# Patient Record
Sex: Male | Born: 1995 | Race: White | Hispanic: No | Marital: Single | Smoking: Never smoker
Health system: Southern US, Community
[De-identification: ages and names within clinical notes are randomized; demographics above are authoritative.]

## PROBLEM LIST (undated history)

## (undated) DIAGNOSIS — Z789 Other specified health status: Secondary | ICD-10-CM

## (undated) DIAGNOSIS — J302 Other seasonal allergic rhinitis: Secondary | ICD-10-CM

## (undated) HISTORY — PX: SHOULDER SURGERY: SHX246

---
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
## 2017-05-17 ENCOUNTER — Other Ambulatory Visit: Payer: Self-pay

## 2017-05-17 ENCOUNTER — Ambulatory Visit
Admission: EM | Admit: 2017-05-17 | Discharge: 2017-05-17 | Disposition: A | Discharge status: Home / Self Care | Payer: BLUE CROSS/BLUE SHIELD | Attending: Family Medicine | Admitting: Family Medicine

## 2017-05-17 ENCOUNTER — Encounter: Payer: Self-pay | Admitting: Emergency Medicine

## 2017-05-17 DIAGNOSIS — L600 Ingrowing nail: Secondary | ICD-10-CM

## 2017-05-17 DIAGNOSIS — J301 Allergic rhinitis due to pollen: Secondary | ICD-10-CM

## 2017-05-17 DIAGNOSIS — R059 Cough, unspecified: Secondary | ICD-10-CM

## 2017-05-17 DIAGNOSIS — R05 Cough: Secondary | ICD-10-CM | POA: Diagnosis not present

## 2017-05-17 HISTORY — DX: Other seasonal allergic rhinitis: J30.2

## 2017-05-17 MED ORDER — CEPHALEXIN 500 MG PO CAPS: 500.0000 mg | ORAL_CAPSULE | Freq: Three times a day (TID) | ORAL | 0 refills | Status: DC

## 2017-05-17 MED ORDER — BENZONATATE 200 MG PO CAPS: 200.0000 mg | ORAL_CAPSULE | Freq: Three times a day (TID) | ORAL | 0 refills | Status: DC | PRN

## 2017-05-17 NOTE — ED Triage Notes (Signed)
Patient in today with his father c/o cough x 1 month, getting worse over the last 2 weeks. Patient's father states the he  has had some sob. Father denies fever. Patient tried Mucinex last night and took Amoxicillin (from aprevious prescription) Saturday and Sunday.

## 2017-05-17 NOTE — ED Triage Notes (Signed)
Patient's father also states that patient left great toe is swollen and infected.

## 2017-05-17 NOTE — ED Provider Notes (Signed)
[facility]    CSN: [id number] Arrival date & time: 05/17/17  0825     History   Chief Complaint Chief Complaint  Patient presents with  . Cough    HPI Parker Montoya is a 22 y.o. male.   Also c/o left big toe redness, swelling, drainage and pain.   The history is provided by the patient.  Cough  Associated symptoms: rhinorrhea   Associated symptoms: no headaches and no wheezing   URI  Presenting symptoms: congestion, cough and rhinorrhea   Severity:  Moderate Onset quality:  Sudden Duration:  3 weeks Timing:  Constant Progression:  Unchanged Chronicity:  New Associated symptoms: no headaches, no sinus pain and no wheezing   Risk factors: recent illness (allergies)   Risk factors: not elderly, no chronic cardiac disease, no chronic kidney disease, no chronic respiratory disease, no diabetes mellitus, no immunosuppression, no recent travel and no sick contacts     Past Medical History:  Diagnosis Date  . Seasonal allergies     There are no active problems to display for this patient.   Past Surgical History:  Procedure Laterality Date  . SHOULDER SURGERY Right        Home Medications    Prior to Admission medications   Medication Sig Start Date End Date Taking? Authorizing Provider  benzonatate (TESSALON) 200 MG capsule Take 1 capsule (200 mg total) by mouth 3 (three) times daily as needed. 05/17/17   Payton Mccallum, MD  cephALEXin (KEFLEX) 500 MG capsule Take 1 capsule (500 mg total) by mouth 3 (three) times daily. 05/17/17   Payton Mccallum, MD  cetirizine-pseudoephedrine (ZYRTEC-D) 5-120 MG tablet Take 1 tablet by mouth 2 (two) times daily. 02/05/15   Lutricia Feil, PA-C  oseltamivir (TAMIFLU) 75 MG capsule Take 1 capsule (75 mg total) by mouth every 12 (twelve) hours. 02/05/15   Lutricia Feil, PA-C    Family History Family History  Problem Relation Age of Onset  . Hypertension Mother   . Vitamin D deficiency Mother   .  Cardiomyopathy Mother        during pregnancy with twins  . Other Father        unknown - no contact    Social History Social History   Tobacco Use  . Smoking status: Never Smoker  . Smokeless tobacco: Never Used  Substance Use Topics  . Alcohol use: No  . Drug use: No     Allergies   Patient has no known allergies.   Review of Systems Review of Systems  HENT: Positive for congestion and rhinorrhea. Negative for sinus pain.   Respiratory: Positive for cough. Negative for wheezing.   Neurological: Negative for headaches.     Physical Exam Triage Vital Signs ED Triage Vitals  Enc Vitals Group     BP 05/17/17 0845 138/85     Pulse Rate 05/17/17 0845 (!) 101     Resp 05/17/17 0845 16     Temp 05/17/17 0845 98.4 F (36.9 C)     Temp Source 05/17/17 0845 Oral     SpO2 05/17/17 0845 97 %     Weight 05/17/17 0844 285 lb 6.4 oz (129.5 kg)     Height 05/17/17 0844  (1.676 m)     Head Circumference --      Peak Flow --      Pain Score 05/17/17 0844 0     Pain Loc --      Pain Edu? --  Excl. in GC? --    No data found.  Updated Vital Signs BP 138/85 (BP Location: Left Arm) Comment (BP Location): lg cuff  Pulse (!) 101   Temp 98.4 F (36.9 C) (Oral)   Resp 16   Ht  (1.676 m)   Wt 285 lb 6.4 oz (129.5 kg)   SpO2 97%   BMI 46.06 kg/m   Visual Acuity Right Eye Distance:   Left Eye Distance:   Bilateral Distance:    Right Eye Near:   Left Eye Near:    Bilateral Near:     Physical Exam  Constitutional: He appears well-developed and well-nourished. No distress.  HENT:  Head: Normocephalic and atraumatic.  Right Ear: Tympanic membrane, external ear and ear canal normal.  Left Ear: Tympanic membrane, external ear and ear canal normal.  Nose: Nose normal.  Mouth/Throat: Uvula is midline, oropharynx is clear and moist and mucous membranes are normal. No oropharyngeal exudate or tonsillar abscesses.  Eyes: Conjunctivae are normal. Right eye  exhibits no discharge. Left eye exhibits no discharge. No scleral icterus.  Neck: Normal range of motion. Neck supple. No tracheal deviation present. No thyromegaly present.  Cardiovascular: Normal rate, regular rhythm and normal heart sounds.  Pulmonary/Chest: Effort normal and breath sounds normal. No stridor. No respiratory distress. He has no wheezes. He has no rales. He exhibits no tenderness.  Lymphadenopathy:    He has no cervical adenopathy.  Neurological: He is alert.  Skin: Skin is warm and dry. No rash noted. He is not diaphoretic.  Left big toe skin with edema, erythema and mild drainage; ingrown toenail  Nursing note and vitals reviewed.    UC Treatments / Results  Labs (all labs ordered are listed, but only abnormal results are displayed) Labs Reviewed - No data to display  EKG None  Radiology No results found.  Procedures Procedures (including critical care time)  Medications Ordered in UC Medications - No data to display  Initial Impression / Assessment and Plan / UC Course  I have reviewed the triage vital signs and the nursing notes.  Pertinent labs & imaging results that were available during my care of the patient were reviewed by me and considered in my medical decision making (see chart for details).      Final Clinical Impressions(s) / UC Diagnoses   Final diagnoses:  Cough  Ingrown toenail of left foot  Seasonal allergic rhinitis due to pollen    ED Prescriptions    Medication Sig Dispense Auth. Provider   cephALEXin (KEFLEX) 500 MG capsule Take 1 capsule (500 mg total) by mouth 3 (three) times daily. 30 capsule Payton Mccallum, MD   benzonatate (TESSALON) 200 MG capsule Take 1 capsule (200 mg total) by mouth 3 (three) times daily as needed. 30 capsule Payton Mccallum, MD     1. diagnosis reviewed with patient 2. rx as per orders above; reviewed possible side effects, interactions, risks and benefits  3. Recommend supportive treatment with  warm compresses 4. Follow-up prn if symptoms worsen or don't improve   Controlled Substance Prescriptions [place] Controlled Substance Registry consulted? Not Applicable   Payton Mccallum, MD 05/17/17 1220

## 2020-12-18 ENCOUNTER — Encounter: Payer: Self-pay | Admitting: Orthopaedic Surgery

## 2020-12-18 ENCOUNTER — Ambulatory Visit (INDEPENDENT_AMBULATORY_CARE_PROVIDER_SITE_OTHER): Payer: BC Managed Care – PPO | Admitting: Orthopaedic Surgery

## 2020-12-18 ENCOUNTER — Other Ambulatory Visit: Payer: Self-pay

## 2020-12-18 ENCOUNTER — Ambulatory Visit: Payer: Self-pay

## 2020-12-18 DIAGNOSIS — M25561 Pain in right knee: Secondary | ICD-10-CM | POA: Diagnosis not present

## 2020-12-18 NOTE — Progress Notes (Signed)
Office Visit Note   Patient: Parker Montoya           Date of Birth: [birth year]           MRN: [id number] Visit Date: 12/18/2020              Requested by: No referring provider defined for this encounter. PCP: Pcp, No   Assessment & Plan: Visit Diagnoses:  1. Acute pain of right knee     Plan: Impression is right knee MCL strain.  I placed patient in a hinged knee brace weightbearing as tolerated.  We will go ahead and order an urgent MRI to assess for structural abnormalities.  Follow-up with [place] once completed.  We have provided without a work note until after his MRI as he has a very physical job and does not have a light duty or desk work option.  He is agreeable to this plan.  Follow-Up Instructions: Return for f/u after MRI.   Orders:  Orders Placed This Encounter  Procedures   XR KNEE 3 VIEW RIGHT   No orders of the defined types were placed in this encounter.     Procedures: No procedures performed   Clinical Data: No additional findings.   Subjective: Chief Complaint  Patient presents with   Right Knee - Pain    HPI patient is a pleasant 25 year old gentleman who comes in today with concerns about his right knee.  This past Saturday, he was doing jujitsu when he sprawled and felt a pop to the medial knee.  He has had pain to the anterior medial knee since.  He denies any pain at rest.  Pain is worse going up and down stairs.  He denies any mechanical symptoms or instability.  Has been using ice and ibuprofen.  He does have previous history of MCL sprain years ago back in high school and is recovered from this..  Review of Systems as detailed in HPI.  All other reviewed and are negative.   Objective: Vital Signs: There were no vitals taken for this visit.  Physical Exam well-developed well-nourished gentleman in no acute distress.  Alert and oriented x3.  Ortho Exam right knee exam shows a small effusion.  Range of motion 0 to 120 degrees.  He does have  medial joint line tenderness as well as tenderness along the MCL.  He does have laxity with valgus stress.  Very minimal laxity with anterior drawer compared to the left knee.  He is neurovascular intact distally.  Specialty Comments:  No specialty comments available.  Imaging: XR KNEE 3 VIEW RIGHT  Result Date: 12/18/2020 No acute or structural abnormalities    PMFS History: There are no problems to display for this patient.  Past Medical History:  Diagnosis Date   Seasonal allergies     Family History  Problem Relation Age of Onset   Hypertension Mother    Vitamin D deficiency Mother    Cardiomyopathy Mother        during pregnancy with twins   Other Father        unknown - no contact    Past Surgical History:  Procedure Laterality Date   SHOULDER SURGERY Right    Social History   Occupational History   Not on file  Tobacco Use   Smoking status: Never   Smokeless tobacco: Never  Vaping Use   Vaping Use: Never used  Substance and Sexual Activity   Alcohol use: No   Drug use: No  Sexual activity: Not on file

## 2020-12-19 ENCOUNTER — Ambulatory Visit
Admission: RE | Admit: 2020-12-19 | Discharge: 2020-12-19 | Disposition: A | Discharge status: Home / Self Care | Payer: BC Managed Care – PPO | Source: Ambulatory Visit | Attending: Orthopaedic Surgery | Admitting: Orthopaedic Surgery

## 2020-12-19 DIAGNOSIS — M25561 Pain in right knee: Secondary | ICD-10-CM

## 2020-12-19 IMAGING — MR MR KNEE*R* W/O CM
4 of 7 series · 23 of 40 positions shown · non-contrast
Comparison: Right knee radiograph [DATE]

CLINICAL DATA: Right knee pain

EXAM:
MRI OF THE RIGHT KNEE WITHOUT CONTRAST
TECHNIQUE: Multiplanar, multisequence MR imaging of the knee was performed. No
intravenous contrast was administered.

[Series 4: T1 · coronal · 4.0mm · 0.29mm/px · 5 of 29 slices shown]
[im 1/29]
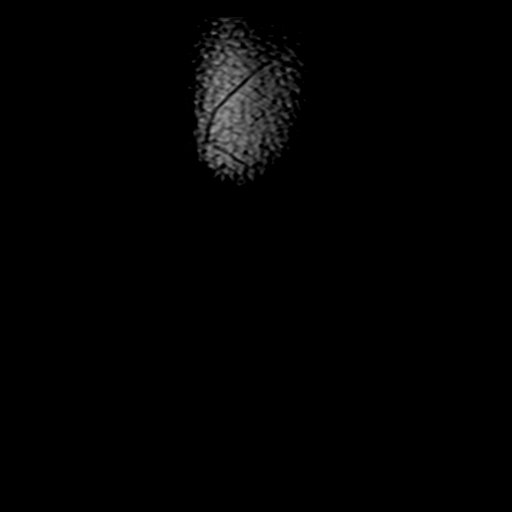
[im 5/29]
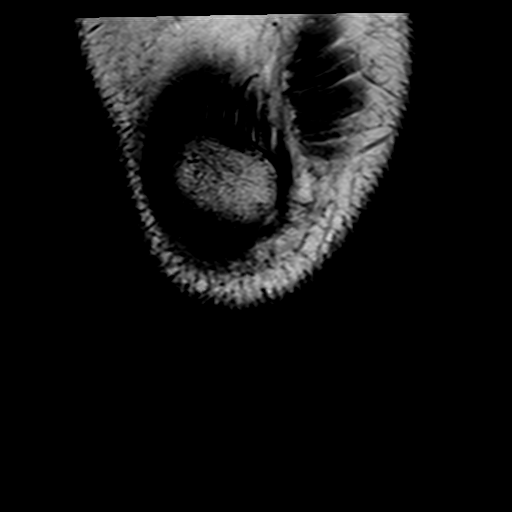
[im 10/29]
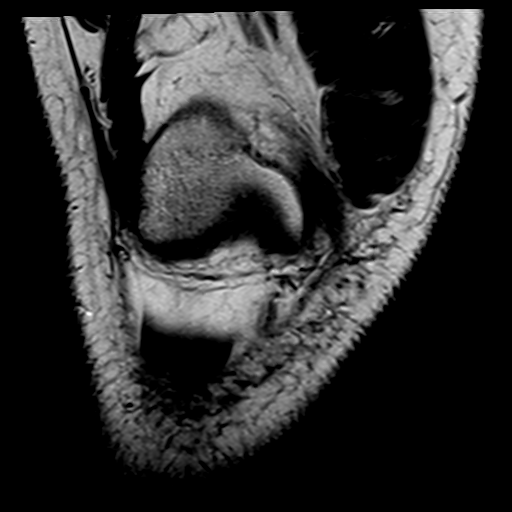
[im 15/29]
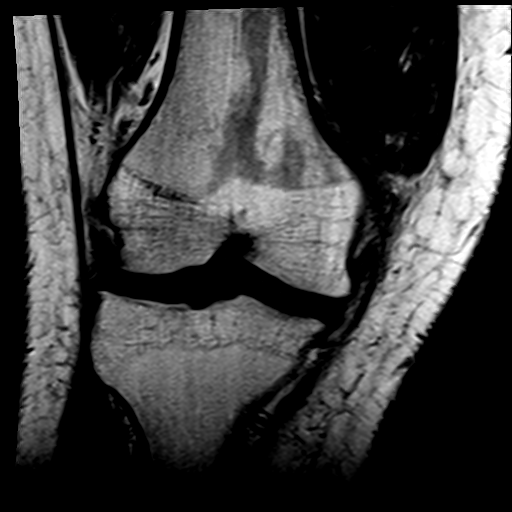
[im 24/29]
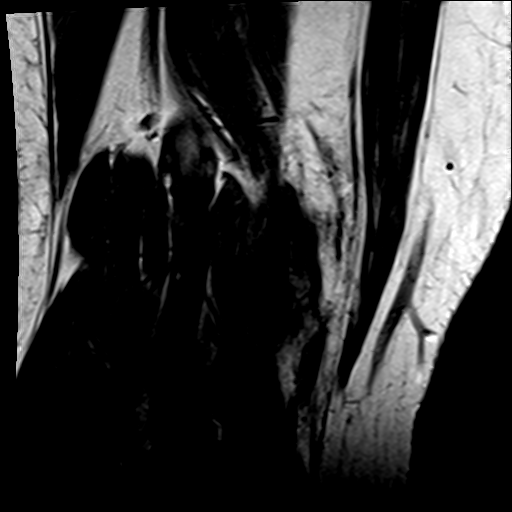

[Series 5: T2 fat-sat · coronal · 4.0mm · 0.59mm/px · 6 of 25 slices shown (1 of 2)]
[im 1/25]
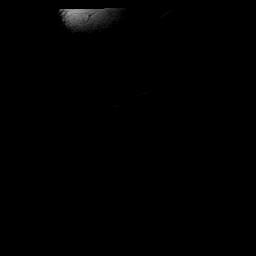
[im 5/25]
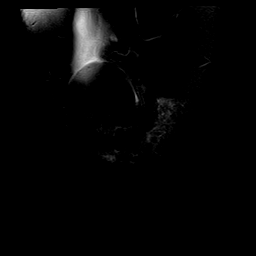
[im 10/25]
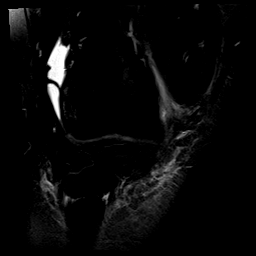
[im 15/25]
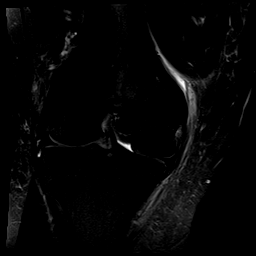
[im 20/25]
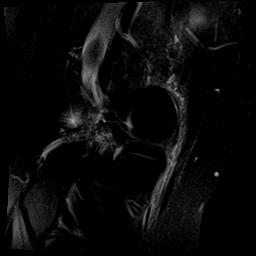
[im 25/25]
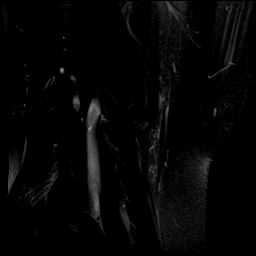

[Series 7: PD fat-sat · sagittal · 3.0mm · 0.29mm/px · 6 of 27 slices shown]
[im 1/27]
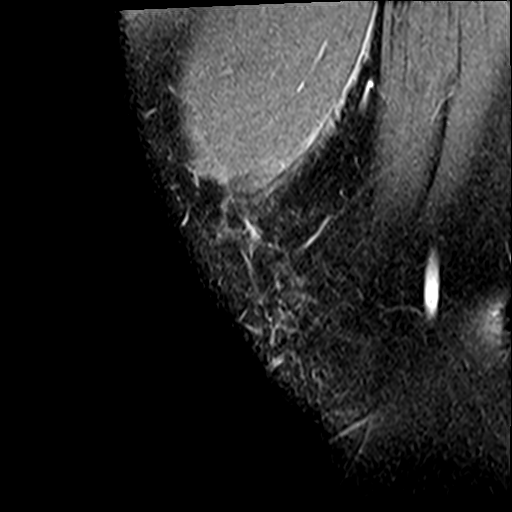
[im 6/27]
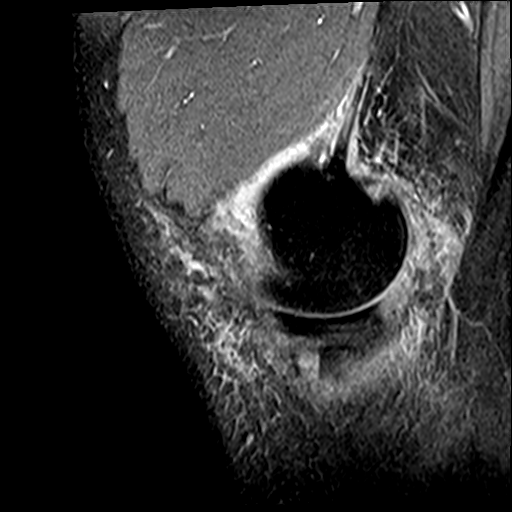
[im 11/27]
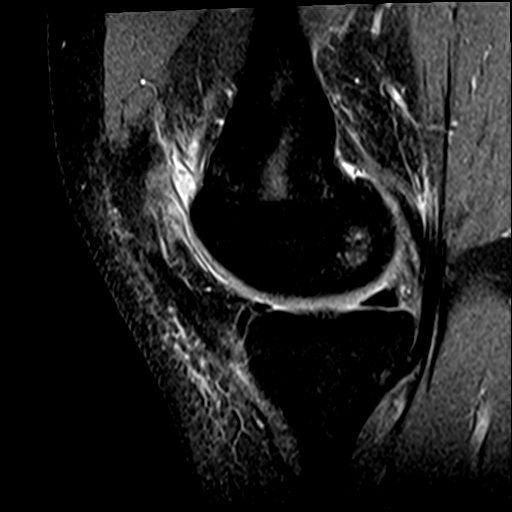
[im 16/27]
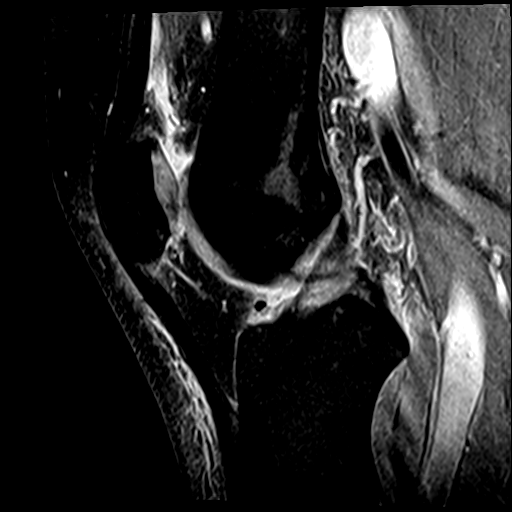
[im 21/27]
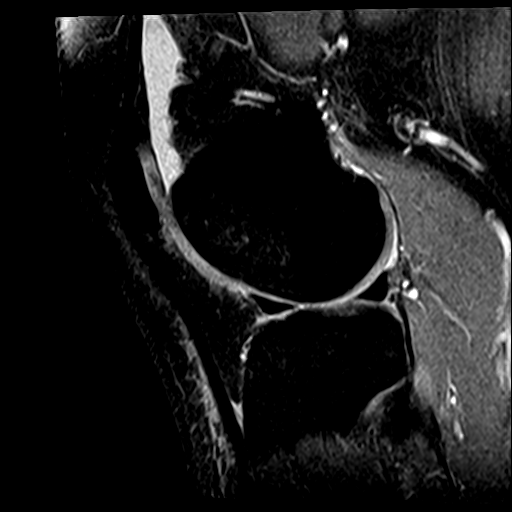
[im 27/27]
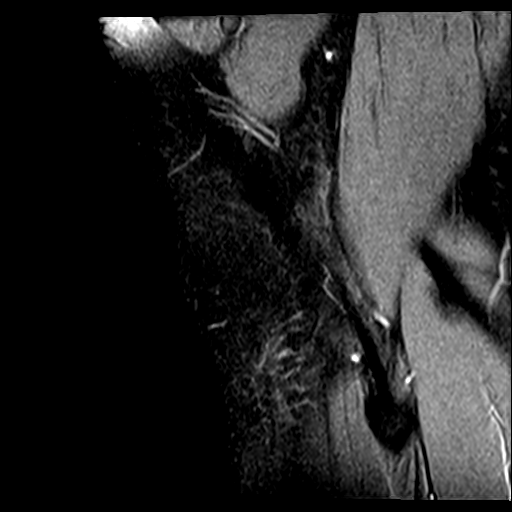

[Series 8: T2 fat-sat · sagittal · 3.0mm · 0.29mm/px · 6 of 27 slices shown (2 of 2)]
[im 1/27]
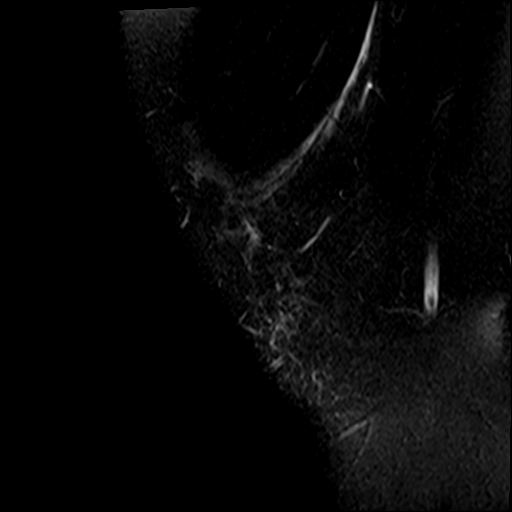
[im 6/27]
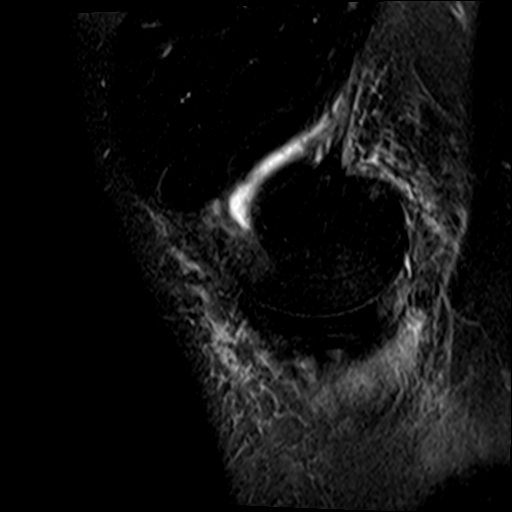
[im 11/27]
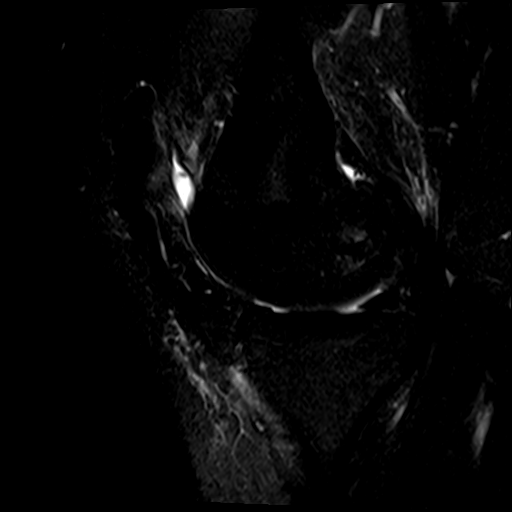
[im 16/27]
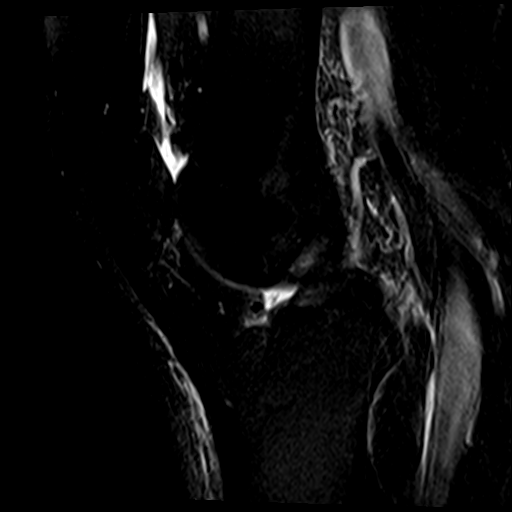
[im 21/27]
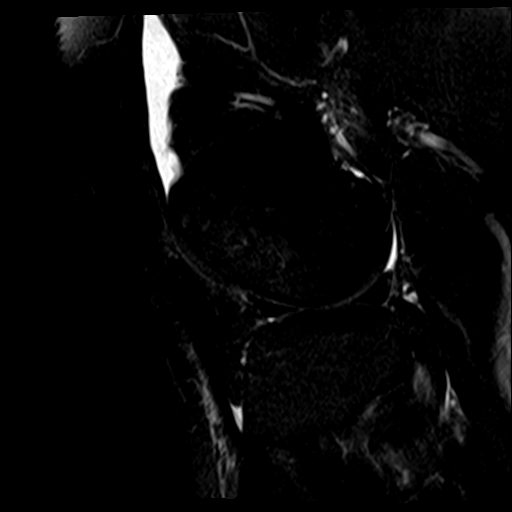
[im 27/27]
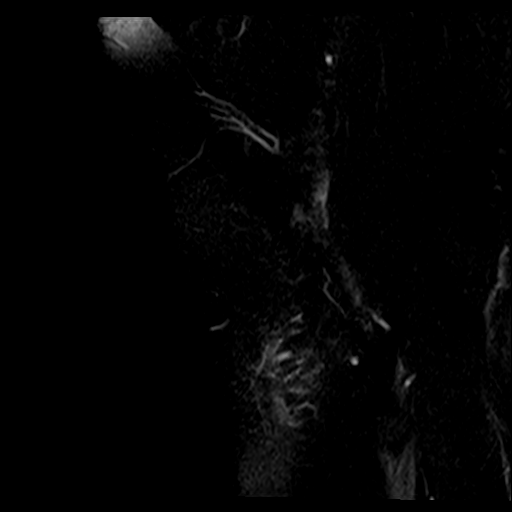

[23 of 40 positions shown; findings below may reference images not displayed]

FINDINGS: MENISCI

Medial: Intact.

Lateral: Intact.

LIGAMENTS

Cruciates: ACL and PCL are intact.

Collaterals: There is periligamentous edema along the medial
collateral ligament. Lateral collateral ligament complex is intact.

CARTILAGE

Patellofemoral:  No chondral defect.

Medial:  No chondral defect.

Lateral:  No chondral defect.

JOINT: Moderate-sized joint effusion.

POPLITEAL FOSSA: No significant Baker cyst.

EXTENSOR MECHANISM: The medial patellofemoral ligament is completely
torn from its femoral sided attachment (axial T2 image 15). Slight
lateral positioning of the patella. TT-TG distance measures 15 mm.
No patella Alta. Trochlear depth measures 4 mm.

BONES: Bony contusion within the lateral femoral condyle.

Other: Soft tissue swelling along the knee. No focal fluid
collection.
IMPRESSION: Sequela of recent transient patellar dislocation. Torn MPFL from its
femoral sided attachment with slight lateral subluxation of the
patella. TT-TG distance measures 15 mm.

Grade 1 MCL sprain.

No evidence of meniscus tear or cruciate ligament injury.

Moderate-sized joint effusion.

## 2020-12-19 IMAGING — MR MR KNEE*R* W/O CM
3 series · 40 of 40 positions shown · non-contrast
Comparison: Right knee radiograph [DATE]

CLINICAL DATA: Right knee pain

EXAM:
MRI OF THE RIGHT KNEE WITHOUT CONTRAST
TECHNIQUE: Multiplanar, multisequence MR imaging of the knee was performed. No
intravenous contrast was administered.

[Series 3: t2_tse_fs_tra · axial · 4.0mm · 0.50mm/px · z∈[-71,+58]mm · 14 of 28 slices shown (1 of 2)]
[im 1/28]
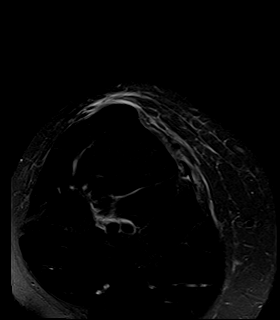
[im 3/28]
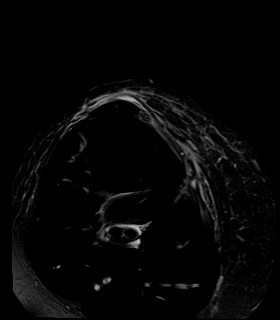
[im 5/28]
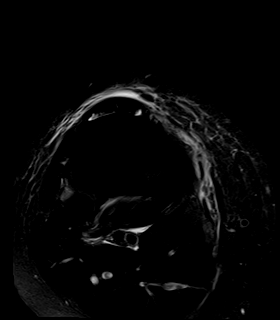
[im 7/28]
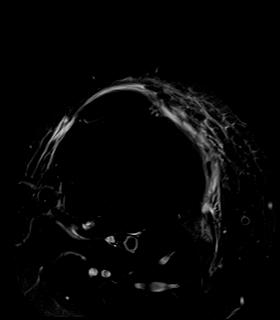
[im 9/28]
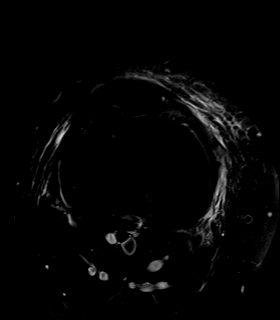
[im 11/28]
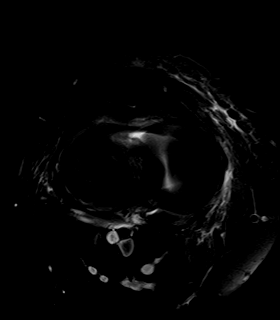
[im 13/28]
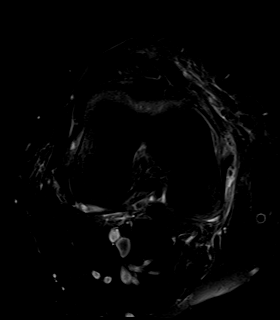
[im 15/28]
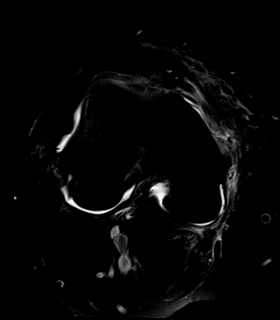
[im 17/28]
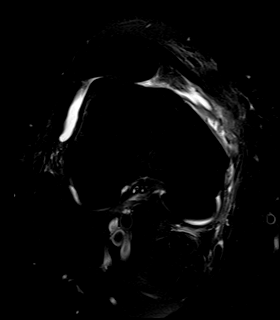
[im 19/28]
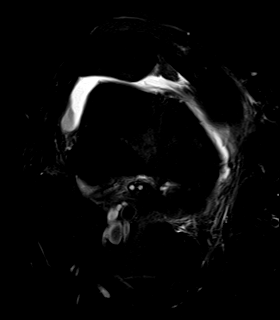
[im 21/28]
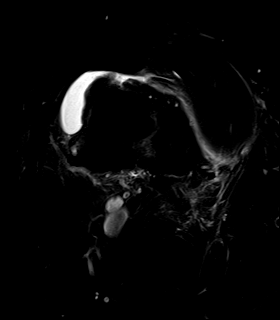
[im 23/28]
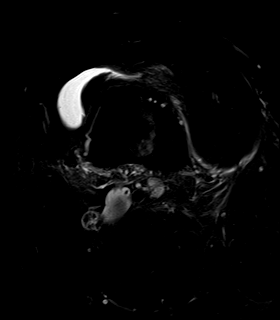
[im 25/28]
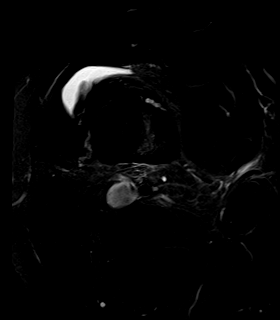
[im 28/28]
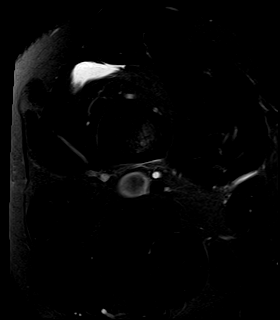

[Series 4: PD · axial · 4.0mm · 0.50mm/px · z∈[-62,+49]mm · 12 of 24 slices shown]
[im 1/24]
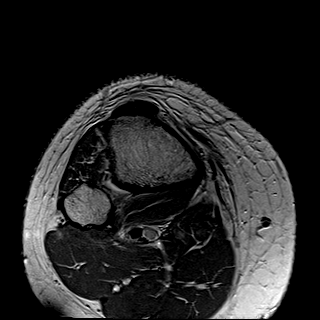
[im 3/24]
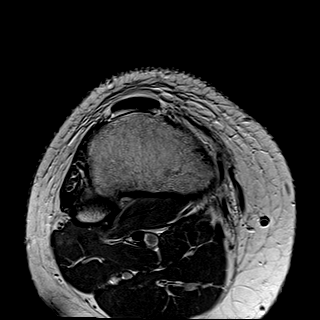
[im 5/24]
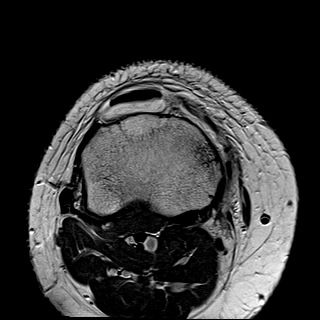
[im 7/24]
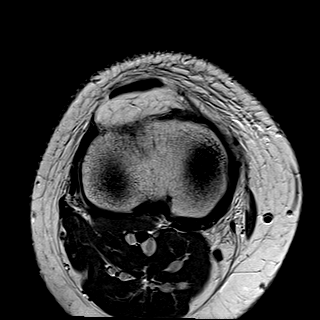
[im 9/24]
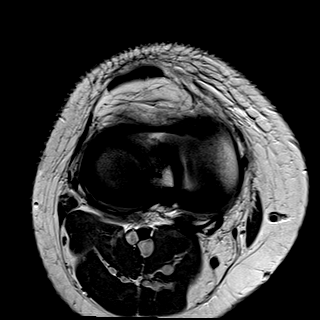
[im 11/24]
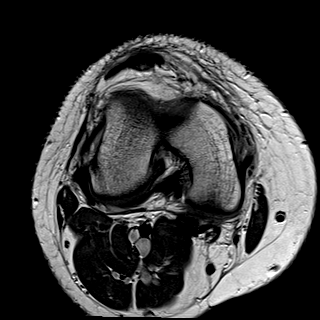
[im 13/24]
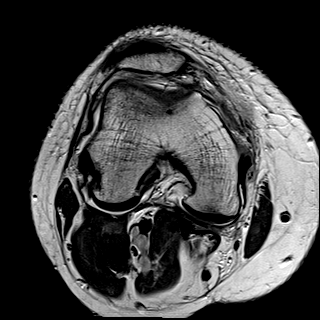
[im 15/24]
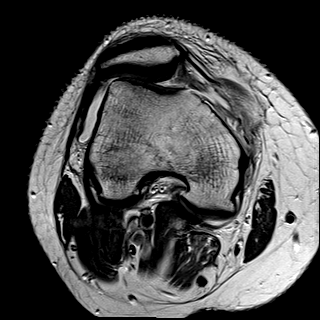
[im 17/24]
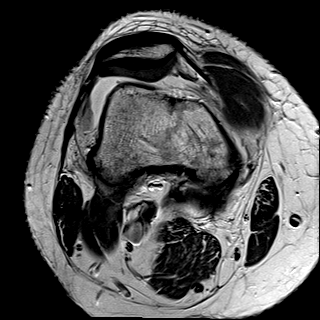
[im 19/24]
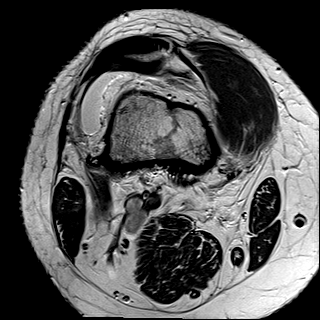
[im 21/24]
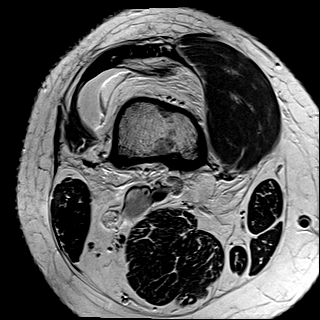
[im 24/24]
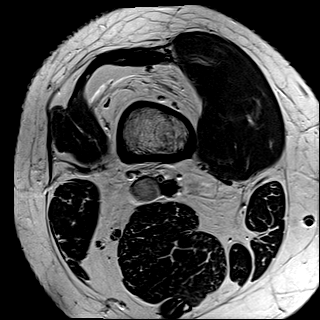

[Series 5: t2_tse_fs_tra · axial · 4.0mm · 0.50mm/px · z∈[-71,+58]mm · 14 of 28 slices shown (2 of 2)]
[im 1/28]
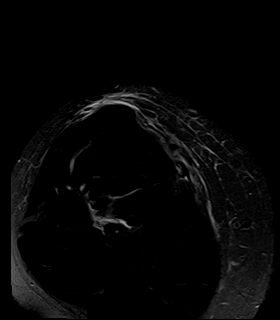
[im 3/28]
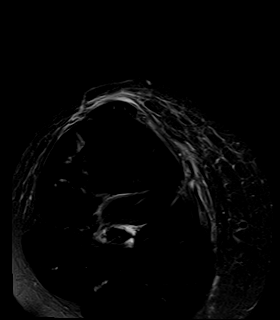
[im 5/28]
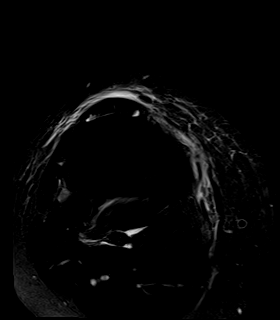
[im 7/28]
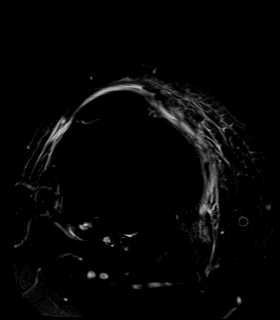
[im 9/28]
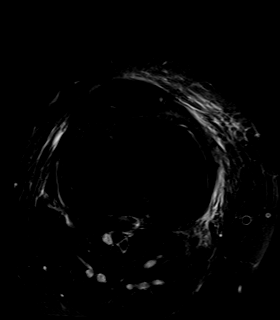
[im 11/28]
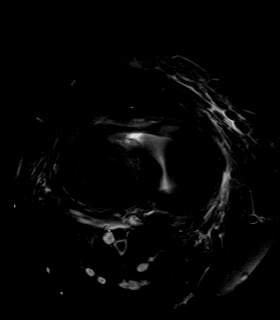
[im 13/28]
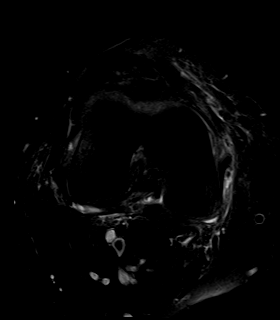
[im 15/28]
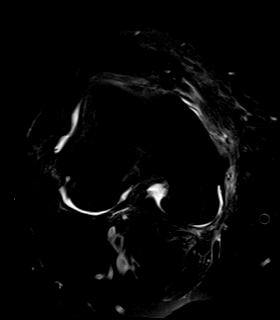
[im 17/28]
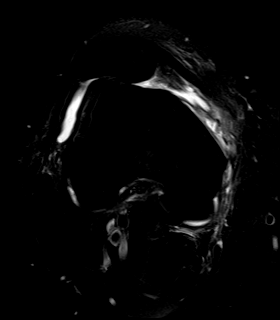
[im 19/28]
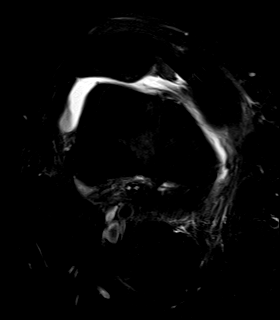
[im 21/28]
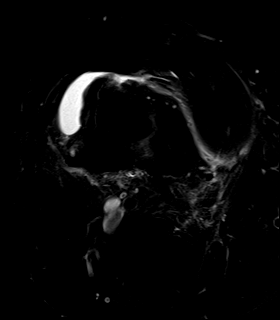
[im 23/28]
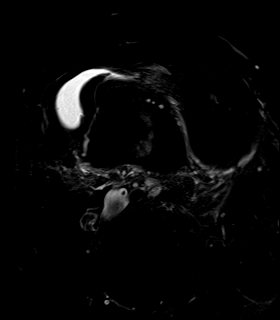
[im 25/28]
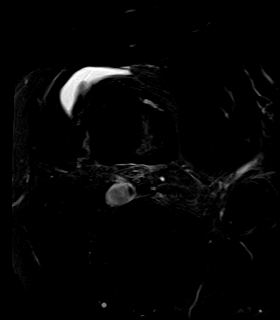
[im 28/28]
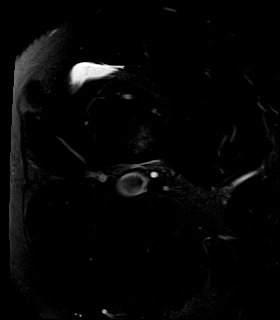

[40 of 40 positions shown; findings below may reference images not displayed]

FINDINGS: MENISCI

Medial: Intact.

Lateral: Intact.

LIGAMENTS

Cruciates: ACL and PCL are intact.

Collaterals: There is periligamentous edema along the medial
collateral ligament. Lateral collateral ligament complex is intact.

CARTILAGE

Patellofemoral:  No chondral defect.

Medial:  No chondral defect.

Lateral:  No chondral defect.

JOINT: Moderate-sized joint effusion.

POPLITEAL FOSSA: No significant Baker cyst.

EXTENSOR MECHANISM: The medial patellofemoral ligament is completely
torn from its femoral sided attachment (axial T2 image 15). Slight
lateral positioning of the patella. TT-TG distance measures 15 mm.
No patella Alta. Trochlear depth measures 4 mm.

BONES: Bony contusion within the lateral femoral condyle.

Other: Soft tissue swelling along the knee. No focal fluid
collection.
IMPRESSION: Sequela of recent transient patellar dislocation. Torn MPFL from its
femoral sided attachment with slight lateral subluxation of the
patella. TT-TG distance measures 15 mm.

Grade 1 MCL sprain.

No evidence of meniscus tear or cruciate ligament injury.

Moderate-sized joint effusion.

## 2020-12-24 ENCOUNTER — Other Ambulatory Visit: Payer: Self-pay

## 2020-12-24 ENCOUNTER — Ambulatory Visit (INDEPENDENT_AMBULATORY_CARE_PROVIDER_SITE_OTHER): Payer: BC Managed Care – PPO | Admitting: Orthopaedic Surgery

## 2020-12-24 ENCOUNTER — Encounter: Payer: Self-pay | Admitting: Orthopaedic Surgery

## 2020-12-24 DIAGNOSIS — M25361 Other instability, right knee: Secondary | ICD-10-CM | POA: Diagnosis not present

## 2020-12-24 DIAGNOSIS — M25561 Pain in right knee: Secondary | ICD-10-CM

## 2020-12-24 NOTE — Progress Notes (Signed)
Office Visit Note   Patient: Parker Montoya           Date of Birth: May 13, 1995           MRN: [id number] Visit Date: 12/24/2020              Requested by: No referring provider defined for this encounter. PCP: Pcp, No   Assessment & Plan: Visit Diagnoses:  1. 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  No significant changes to the knee.  Right knee exam show stable collaterals and cruciates.  Positive patellar apprehension tenderness to the medial retinaculum and the medial femoral condyle.  Laterally tracking patella.  Small effusion.  The MRI of the right knee shows full-thickness tear of the femoral insertion of the medial patellofemoral ligament.  Femoral trochlea appears to be shallow.  No significant patella alta.  Given these findings treatment options were discussed.  Patient would really like to return back to work and to martial arts soon as possible therefore I have recommended MPFL reconstruction.  We provided him with a PSO brace for today.  Light duty for for now.  Risk benefits rehab recovery of the surgery reviewed with the patient detail.  Follow-Up Instructions: No follow-ups on file.   Orders:  No orders of the defined types were placed in this encounter.  No orders of the defined types were placed in this encounter.     Procedures: No procedures performed   Clinical Data: No additional findings.   Subjective: Chief Complaint  Patient presents with   Right Knee - Follow-up    MRI review    HPI  Review of Systems  Constitutional: Negative.   All other systems reviewed and are negative.   Objective: Vital Signs: There were no vitals taken for this visit.  Physical Exam Vitals and nursing note reviewed.  Constitutional:      Appearance: He is well-developed.  Pulmonary:     Effort: Pulmonary effort is normal.  Abdominal:     Palpations: Abdomen is soft.  Skin:     General: Skin is warm.  Neurological:     Mental Status: He is alert and oriented to person, place, and time.  Psychiatric:        Behavior: Behavior normal.        Thought Content: Thought content normal.        Judgment: Judgment normal.    Ortho Exam  Specialty Comments:  No specialty comments available.  Imaging: No results found.   PMFS History: There are no problems to display for this patient.  Past Medical History:  Diagnosis Date   Seasonal allergies     Family History  Problem Relation Age of Onset   Hypertension Mother    Vitamin D deficiency Mother    Cardiomyopathy Mother        during pregnancy with twins   Other Father        unknown - no contact    Past Surgical History:  Procedure Laterality Date   SHOULDER SURGERY Right    Social History   Occupational History   Not on file  Tobacco Use   Smoking status: Never   Smokeless tobacco: Never  Vaping Use   Vaping Use: Never used  Substance and Sexual Activity   Alcohol use: No   Drug use: No   Sexual activity: Not on file

## 2020-12-25 ENCOUNTER — Telehealth: Payer: Self-pay | Admitting: Orthopaedic Surgery

## 2020-12-25 ENCOUNTER — Ambulatory Visit (INDEPENDENT_AMBULATORY_CARE_PROVIDER_SITE_OTHER): Payer: BC Managed Care – PPO | Admitting: Orthopaedic Surgery

## 2020-12-25 DIAGNOSIS — M25561 Pain in right knee: Secondary | ICD-10-CM

## 2020-12-25 DIAGNOSIS — M25361 Other instability, right knee: Secondary | ICD-10-CM

## 2020-12-25 NOTE — Telephone Encounter (Signed)
Received $25.00 cash, medical records release form and FMLA/Disability paperwork from patient      Forwarding to [employer] today 

## 2020-12-25 NOTE — Progress Notes (Signed)
cancelled

## 2020-12-25 NOTE — Telephone Encounter (Signed)
Patient asked for a call to schedule surgery. The number to contact patient is [phone]

## 2020-12-30 ENCOUNTER — Other Ambulatory Visit: Payer: Self-pay

## 2020-12-30 ENCOUNTER — Ambulatory Visit
Admission: EM | Admit: 2020-12-30 | Discharge: 2020-12-30 | Disposition: A | Discharge status: Home / Self Care | Payer: BC Managed Care – PPO | Attending: Emergency Medicine | Admitting: Emergency Medicine

## 2020-12-30 ENCOUNTER — Ambulatory Visit: Admit: 2020-12-30 | Discharge status: Absent without leave | Payer: Self-pay

## 2020-12-30 DIAGNOSIS — Z20822 Contact with and (suspected) exposure to covid-19: Secondary | ICD-10-CM | POA: Diagnosis not present

## 2020-12-30 DIAGNOSIS — J101 Influenza due to other identified influenza virus with other respiratory manifestations: Secondary | ICD-10-CM | POA: Diagnosis not present

## 2020-12-30 LAB — RESP PANEL BY RT-PCR (FLU A&B, COVID) ARPGX2
Influenza A by PCR: POSITIVE — AB
Influenza B by PCR: NEGATIVE
SARS Coronavirus 2 by RT PCR: NEGATIVE

## 2020-12-30 MED ORDER — OSELTAMIVIR PHOSPHATE 75 MG PO CAPS: 75.0000 mg | ORAL_CAPSULE | Freq: Two times a day (BID) | ORAL | 0 refills | Status: DC

## 2020-12-30 MED ORDER — PROMETHAZINE-DM 6.25-15 MG/5ML PO SYRP: 5.0000 mL | ORAL_SOLUTION | Freq: Four times a day (QID) | ORAL | 0 refills | Status: DC | PRN

## 2020-12-30 MED ORDER — BENZONATATE 100 MG PO CAPS: 100.0000 mg | ORAL_CAPSULE | Freq: Three times a day (TID) | ORAL | 0 refills | Status: DC

## 2020-12-30 NOTE — ED Provider Notes (Signed)
[facility]    CSN: [id number] Arrival date & time: 12/30/20  0957      History   Chief Complaint Chief Complaint  Patient presents with   Flu like Symptoms    HPI Parker Montoya is a 25 y.o. male.   Patient presents with chills, fever, nasal congestion, rhinorrhea, sore throat, productive cough, chest tightness, vomiting and intermittent generalized headaches for 2 days.  Last episode of vomiting 1 day ago.  Tolerating food and liquid. Has attempted, tylenol, viramins, somewhst helpful.  Known sick contacts.  History of seasonal allergies.  Past Medical History:  Diagnosis Date   Seasonal allergies     There are no problems to display for this patient.   Past Surgical History:  Procedure Laterality Date   SHOULDER SURGERY Right        Home Medications    Prior to Admission medications   Medication Sig Start Date End Date Taking? Authorizing Provider  acetaminophen (TYLENOL) 325 MG tablet Take 650 mg by mouth every 6 (six) hours as needed.   Yes [provider]  UNABLE TO FIND Med Name: Vitamins (multiple)   Yes [provider]  UNKNOWN TO PATIENT Allergy Med.   Yes [provider]  benzonatate (TESSALON) 200 MG capsule Take 1 capsule (200 mg total) by mouth 3 (three) times daily as needed. 05/17/17   Payton Mccallum, MD  cephALEXin (KEFLEX) 500 MG capsule Take 1 capsule (500 mg total) by mouth 3 (three) times daily. 05/17/17   Payton Mccallum, MD  cetirizine-pseudoephedrine (ZYRTEC-D) 5-120 MG tablet Take 1 tablet by mouth 2 (two) times daily. 02/05/15   Lutricia Feil, PA-C  oseltamivir (TAMIFLU) 75 MG capsule Take 1 capsule (75 mg total) by mouth every 12 (twelve) hours. 02/05/15   Lutricia Feil, PA-C    Family History Family History  Problem Relation Age of Onset   Hypertension Mother    Vitamin D deficiency Mother    Cardiomyopathy Mother        during pregnancy with twins   Other Father        unknown - no contact     Social History Social History   Tobacco Use   Smoking status: Never   Smokeless tobacco: Never  Vaping Use   Vaping Use: Never used  Substance Use Topics   Alcohol use: No   Drug use: No     Allergies   Patient has no known allergies.   Review of Systems Review of Systems  Constitutional:  Positive for chills and fever. Negative for activity change, appetite change, diaphoresis, fatigue and unexpected weight change.  HENT:  Positive for congestion, rhinorrhea and sore throat. Negative for dental problem, drooling, ear discharge, ear pain, facial swelling, hearing loss, mouth sores, nosebleeds, postnasal drip, sinus pressure, sinus pain, sneezing, tinnitus, trouble swallowing and voice change.   Respiratory:  Positive for cough. Negative for apnea, choking, chest tightness, shortness of breath, wheezing and stridor.   Cardiovascular: Negative.   Gastrointestinal:  Positive for vomiting. Negative for abdominal distention, abdominal pain, anal bleeding, blood in stool, constipation, diarrhea, nausea and rectal pain.  Skin: Negative.   Neurological:  Positive for headaches. Negative for dizziness, tremors, seizures, syncope, facial asymmetry, speech difficulty, weakness, light-headedness and numbness.    Physical Exam Triage Vital Signs ED Triage Vitals  Enc Vitals Group     BP 12/30/20 1007 (!) 154/81     Pulse Rate 12/30/20 1007 (!) 123     Resp  12/30/20 1007 (!) 24     Temp 12/30/20 1007 99.1 F (37.3 C)     Temp Source 12/30/20 1007 Oral     SpO2 12/30/20 1007 96 %     Weight 12/30/20 1009 (!) 311 lb (141.1 kg)     Height --      Head Circumference --      Peak Flow --      Pain Score 12/30/20 1006 8     Pain Loc --      Pain Edu? --      Excl. in GC? --    No data found.  Updated Vital Signs BP (!) 154/81 (BP Location: Left Arm)    Pulse (!) 114    Temp 99.1 F (37.3 C) (Oral)    Resp (!) 24    Wt (!) 311 lb (141.1 kg)    SpO2 96%    BMI 50.20 kg/m    Visual Acuity Right Eye Distance:   Left Eye Distance:   Bilateral Distance:    Right Eye Near:   Left Eye Near:    Bilateral Near:     Physical Exam Constitutional:      Appearance: Normal appearance.  HENT:     Head: Normocephalic.     Right Ear: Tympanic membrane, ear canal and external ear normal.     Left Ear: Tympanic membrane, ear canal and external ear normal.     Nose: Congestion present.     Mouth/Throat:     Mouth: Mucous membranes are moist.     Pharynx: Posterior oropharyngeal erythema present.  Eyes:     Extraocular Movements: Extraocular movements intact.  Cardiovascular:     Rate and Rhythm: Normal rate and regular rhythm.     Pulses: Normal pulses.     Heart sounds: Normal heart sounds.  Pulmonary:     Effort: Pulmonary effort is normal.     Breath sounds: Normal breath sounds.  Musculoskeletal:     Cervical back: Normal range of motion and neck supple.  Skin:    General: Skin is warm and dry.  Neurological:     Mental Status: He is alert and oriented to person, place, and time. Mental status is at baseline.  Psychiatric:        Mood and Affect: Mood normal.        Behavior: Behavior normal.     UC Treatments / Results  Labs (all labs ordered are listed, but only abnormal results are displayed) Labs Reviewed  RESP PANEL BY RT-PCR (FLU A&B, COVID) [id number]    EKG   Radiology No results found.  Procedures Procedures (including critical care time)  Medications Ordered in UC Medications - No data to display  Initial Impression / Assessment and Plan / UC Course  I have reviewed the triage vital signs and the nursing notes.  Pertinent labs & imaging results that were available during my care of the patient were reviewed by me and considered in my medical decision making (see chart for details).  Influenza A  Negative for COVID Tamiflu 75 mg bid for 5 days Tessalon 100 mg tid prn Promethazine DM 6.25-15mg /15mL qid prn  Over-the-counter  medications remaining symptom management 6. urgent care follow-up as needed Final Clinical Impressions(s) / UC Diagnoses   Final diagnoses:  None   Discharge Instructions   None    ED Prescriptions   None    PDMP not reviewed this encounter.   Valinda Hoar, NP 12/30/20 1121

## 2020-12-30 NOTE — Discharge Instructions (Addendum)
Your flu test was positive, negative for COVID  You may take Tamiflu twice daily for the next 5 days, if you are able to get this medication from the pharmacy then continue supportive treatment, symptoms will improve as the virus works as well after system whether or not you take this medication  May use Tessalon pill every 8 hours as needed for to help calm coughing  You may use cough syrup every 6 hours, this medication may make you drowsy, if this occurs you may take at bedtime    You can take Tylenol and/or Ibuprofen as needed for fever reduction and pain relief.   For cough: honey 1/2 to 1 teaspoon (you can dilute the honey in water or another fluid).  You can also use guaifenesin and dextromethorphan for cough. You can use a humidifier for chest congestion and cough.  If you don't have a humidifier, you can sit in the bathroom with the hot shower running.      For sore throat: try warm salt water gargles, cepacol lozenges, throat spray, warm tea or water with lemon/honey, popsicles or ice, or OTC cold relief medicine for throat discomfort.   For congestion: take a daily anti-histamine like Zyrtec, Claritin, and a oral decongestant, such as pseudoephedrine.  You can also use Flonase 1-2 sprays in each nostril daily.   It is important to stay hydrated: drink plenty of fluids (water, gatorade/powerade/pedialyte, juices, or teas) to keep your throat moisturized and help further relieve irritation/discomfort.

## 2020-12-30 NOTE — ED Triage Notes (Signed)
Patient is here for "Flu like symptoms". Started with "Cough" yesterday, now productive. Ha's, chills, chest burning with cough "no pain". Vomiting "x1 yesterday". Fever "100.2, yesterday, none today". No new/unexplained rash. No covid19 tests at home. COVID19 vaccines "done x2". No flu vaccine.

## 2021-01-14 ENCOUNTER — Telehealth: Payer: Self-pay | Admitting: Orthopaedic Surgery

## 2021-01-14 ENCOUNTER — Encounter (HOSPITAL_COMMUNITY): Payer: Self-pay | Admitting: Orthopaedic Surgery

## 2021-01-14 ENCOUNTER — Other Ambulatory Visit: Payer: Self-pay

## 2021-01-14 NOTE — Progress Notes (Signed)
SDW CALL  Patient was given pre-op instructions over the phone. The opportunity was given for the patient to ask questions. No further questions asked. Patient verbalized understanding of instructions given.  Patient denies shortness of breath, fever, cough and chest pain over the phone call   PCP - [facility], [place], [place] Cardiologist -   PPM/ICD - n/a Device Orders -  Rep Notified -   Chest x-ray - n/a EKG - n/a Stress Test - patient denies ECHO - patient denies Cardiac Cath - patient denies  Sleep Study - no CPAP -   Fasting Blood Sugar - n/a Checks Blood Sugar _____ times a day  Blood Thinner Instructions: n/a Aspirin Instructions: n/a  ERAS Protcol - Clears until 1200 PRE-SURGERY Ensure or G2-   COVID TEST- Ambulatory surgery - patient was instructed to wear a mask between now and Thursday if out in public and to report any new symptoms to Dr. Roda Shutters   Anesthesia review: no

## 2021-01-14 NOTE — Telephone Encounter (Signed)
Received $25.00 cash,medical records release form and attending physicians statement    Forwarding to [employer] today

## 2021-01-16 ENCOUNTER — Ambulatory Visit (HOSPITAL_COMMUNITY): Payer: BC Managed Care – PPO

## 2021-01-16 ENCOUNTER — Ambulatory Visit (HOSPITAL_COMMUNITY): Payer: BC Managed Care – PPO | Admitting: Anesthesiology

## 2021-01-16 ENCOUNTER — Encounter (HOSPITAL_COMMUNITY)
Admission: RE | Disposition: A | Discharge status: Home / Self Care | Payer: Self-pay | Source: Home / Self Care | Attending: Orthopaedic Surgery

## 2021-01-16 ENCOUNTER — Ambulatory Visit (HOSPITAL_COMMUNITY)
Admission: RE | Admit: 2021-01-16 | Discharge: 2021-01-16 | Disposition: A | Discharge status: Home / Self Care | Payer: BC Managed Care – PPO | Attending: Orthopaedic Surgery | Admitting: Orthopaedic Surgery

## 2021-01-16 DIAGNOSIS — Z419 Encounter for procedure for purposes other than remedying health state, unspecified: Secondary | ICD-10-CM

## 2021-01-16 DIAGNOSIS — S83014A Lateral dislocation of right patella, initial encounter: Secondary | ICD-10-CM | POA: Insufficient documentation

## 2021-01-16 DIAGNOSIS — S83411A Sprain of medial collateral ligament of right knee, initial encounter: Secondary | ICD-10-CM

## 2021-01-16 DIAGNOSIS — M2241 Chondromalacia patellae, right knee: Secondary | ICD-10-CM | POA: Diagnosis not present

## 2021-01-16 DIAGNOSIS — Z6841 Body Mass Index (BMI) 40.0 and over, adult: Secondary | ICD-10-CM | POA: Diagnosis not present

## 2021-01-16 DIAGNOSIS — S76111A Strain of right quadriceps muscle, fascia and tendon, initial encounter: Secondary | ICD-10-CM | POA: Diagnosis not present

## 2021-01-16 DIAGNOSIS — X58XXXA Exposure to other specified factors, initial encounter: Secondary | ICD-10-CM | POA: Diagnosis not present

## 2021-01-16 DIAGNOSIS — Z8739 Personal history of other diseases of the musculoskeletal system and connective tissue: Secondary | ICD-10-CM

## 2021-01-16 DIAGNOSIS — M94261 Chondromalacia, right knee: Secondary | ICD-10-CM | POA: Diagnosis not present

## 2021-01-16 DIAGNOSIS — M25361 Other instability, right knee: Secondary | ICD-10-CM

## 2021-01-16 HISTORY — DX: Other specified health status: Z78.9

## 2021-01-16 HISTORY — PX: MEDIAL PATELLOFEMORAL LIGAMENT REPAIR: SHX2020

## 2021-01-16 LAB — CBC
HCT: 47.6 % (ref 39.0–52.0)
Hemoglobin: 15.4 g/dL (ref 13.0–17.0)
MCH: 28.2 pg (ref 26.0–34.0)
MCHC: 32.4 g/dL (ref 30.0–36.0)
MCV: 87.2 fL (ref 80.0–100.0)
Platelets: 391 10*3/uL (ref 150–400)
RBC: 5.46 MIL/uL (ref 4.22–5.81)
RDW: 12.6 % (ref 11.5–15.5)
WBC: 7.3 10*3/uL (ref 4.0–10.5)
nRBC: 0 % (ref 0.0–0.2)

## 2021-01-16 IMAGING — RF DG KNEE 1-2V*R*
1 series · 2 of 2 positions shown · non-contrast
Comparison: Right knee x-ray [DATE].

CLINICAL DATA: Right knee arthroscopy

EXAM:
RIGHT KNEE - 1-2 VIEW

[Series 1: unknown protocol · 0.20mm/px · 2 of 2 slices shown]
[im 1/2]
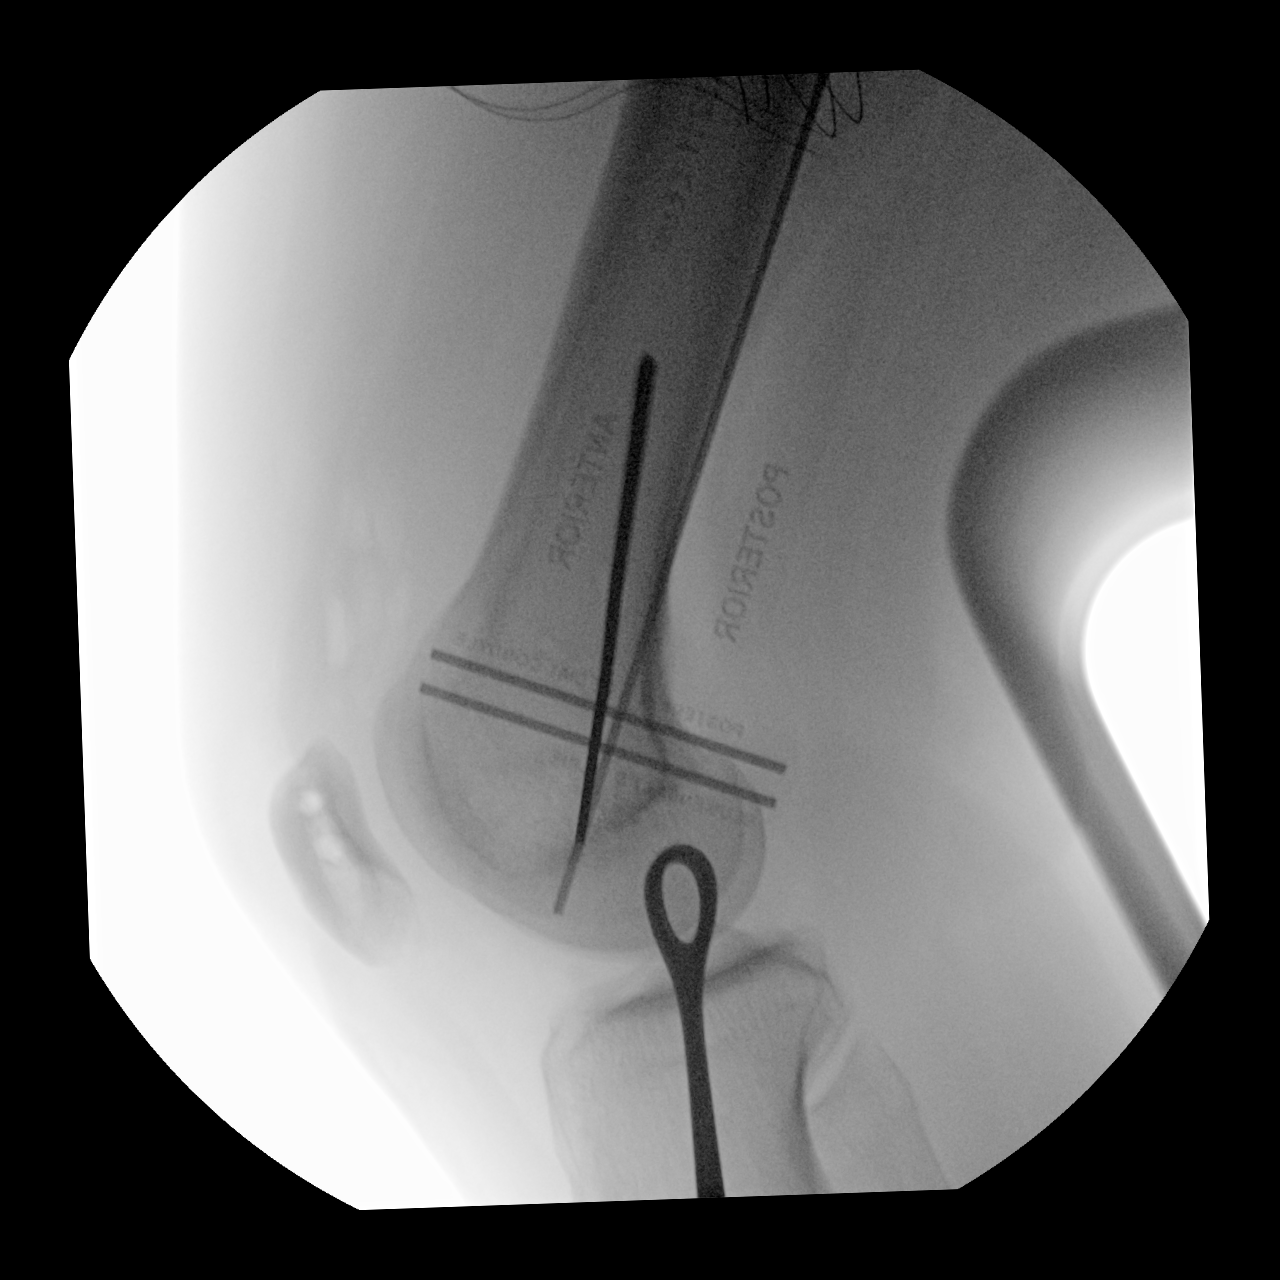
[im 2/2]
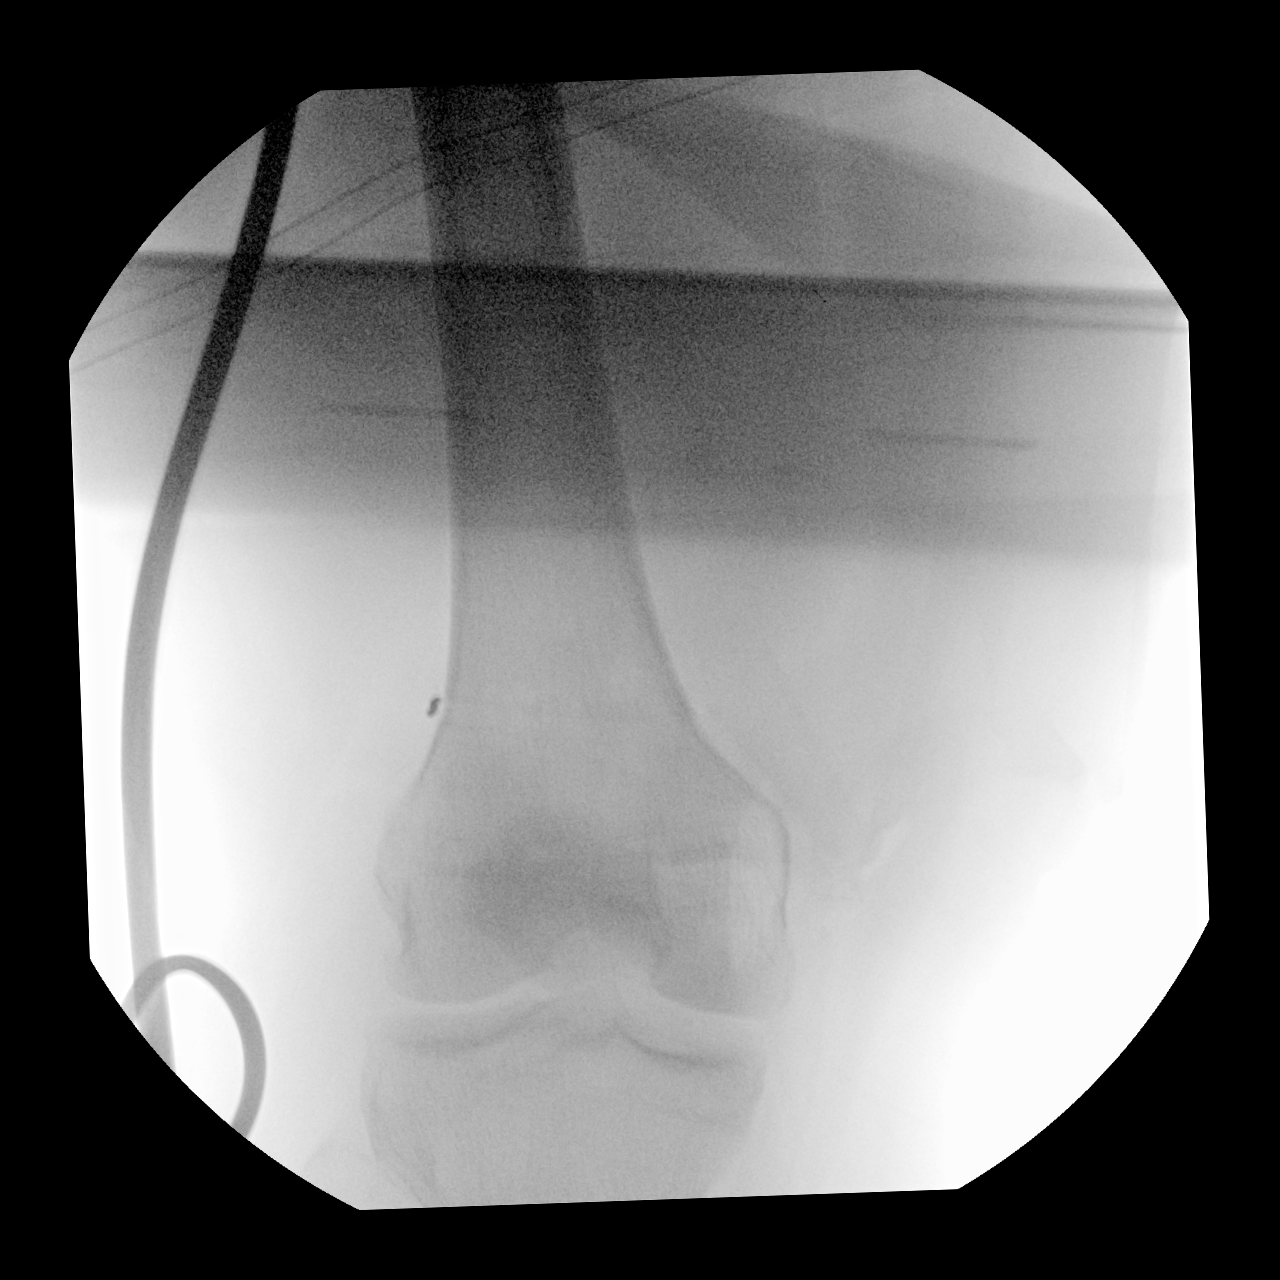

[2 of 2 positions shown; findings below may reference images not displayed]

FINDINGS: Intraoperative right knee.

2 low resolution intraoperative spot views of the right knee were
obtained. No fracture visible on the limited views.

Total fluoroscopy time: 1 minute 3 seconds

Total radiation dose: 2.88 micro Gy
IMPRESSION: Intraoperative right knee.

## 2021-01-16 SURGERY — RECONSTRUCTION, LIGAMENT, MEDIAL PATELLOFEMORAL
Anesthesia: General | Site: Knee | Laterality: Right

## 2021-01-16 MED ORDER — FENTANYL CITRATE (PF) 250 MCG/5ML IJ SOLN
INTRAMUSCULAR | Status: AC
  Filled 2021-01-16: qty 5

## 2021-01-16 MED ORDER — MIDAZOLAM HCL 2 MG/2ML IJ SOLN
INTRAMUSCULAR | Status: AC
  Filled 2021-01-16: qty 2

## 2021-01-16 MED ORDER — ORAL CARE MOUTH RINSE: 15.0000 mL | Freq: Once | OROMUCOSAL | Status: AC

## 2021-01-16 MED ORDER — CLONIDINE HCL (ANALGESIA) 100 MCG/ML EP SOLN
EPIDURAL | Status: AC
  Filled 2021-01-16: qty 10

## 2021-01-16 MED ORDER — SODIUM CHLORIDE 0.9 % IR SOLN
Status: DC | PRN
  Administered 2021-01-16: 1000 mL
  Administered 2021-01-16: 3000 mL

## 2021-01-16 MED ORDER — LACTATED RINGERS IV SOLN: INTRAVENOUS | Status: DC

## 2021-01-16 MED ORDER — FENTANYL CITRATE (PF) 100 MCG/2ML IJ SOLN
25.0000 ug | INTRAMUSCULAR | Status: DC | PRN
  Administered 2021-01-16 (×2): 50 ug via INTRAVENOUS

## 2021-01-16 MED ORDER — ONDANSETRON HCL 4 MG/2ML IJ SOLN
INTRAMUSCULAR | Status: DC | PRN
  Administered 2021-01-16: 4 mg via INTRAVENOUS

## 2021-01-16 MED ORDER — MIDAZOLAM HCL 2 MG/2ML IJ SOLN
INTRAMUSCULAR | Status: DC | PRN
Start: 2021-01-16 — End: 2021-01-16
  Administered 2021-01-16: 2 mg via INTRAVENOUS

## 2021-01-16 MED ORDER — OXYCODONE HCL 5 MG PO TABS
5.0000 mg | ORAL_TABLET | Freq: Once | ORAL | Status: AC | PRN
  Administered 2021-01-16: 5 mg via ORAL

## 2021-01-16 MED ORDER — PROPOFOL 10 MG/ML IV BOLUS
INTRAVENOUS | Status: AC
  Filled 2021-01-16: qty 20

## 2021-01-16 MED ORDER — FENTANYL CITRATE (PF) 250 MCG/5ML IJ SOLN
INTRAMUSCULAR | Status: DC | PRN
  Administered 2021-01-16 (×6): 50 ug via INTRAVENOUS
  Administered 2021-01-16: 100 ug via INTRAVENOUS
  Administered 2021-01-16 (×4): 50 ug via INTRAVENOUS

## 2021-01-16 MED ORDER — AMISULPRIDE (ANTIEMETIC) 5 MG/2ML IV SOLN: 10.0000 mg | Freq: Once | INTRAVENOUS | Status: DC | PRN

## 2021-01-16 MED ORDER — PROPOFOL 10 MG/ML IV BOLUS
INTRAVENOUS | Status: DC | PRN
  Administered 2021-01-16: 250 mg via INTRAVENOUS

## 2021-01-16 MED ORDER — OXYCODONE HCL 5 MG PO TABS
ORAL_TABLET | ORAL | Status: AC
  Filled 2021-01-16: qty 1

## 2021-01-16 MED ORDER — FENTANYL CITRATE (PF) 100 MCG/2ML IJ SOLN
INTRAMUSCULAR | Status: AC
  Filled 2021-01-16: qty 2

## 2021-01-16 MED ORDER — MORPHINE SULFATE (PF) 4 MG/ML IV SOLN
INTRAVENOUS | Status: AC
  Filled 2021-01-16: qty 1

## 2021-01-16 MED ORDER — DEXMEDETOMIDINE (PRECEDEX) IN NS 20 MCG/5ML (4 MCG/ML) IV SYRINGE
PREFILLED_SYRINGE | INTRAVENOUS | Status: DC | PRN
  Administered 2021-01-16: 20 ug via INTRAVENOUS

## 2021-01-16 MED ORDER — BUPIVACAINE-EPINEPHRINE 0.25% -1:200000 IJ SOLN
INTRAMUSCULAR | Status: DC | PRN
  Administered 2021-01-16: 60 mL

## 2021-01-16 MED ORDER — OXYCODONE-ACETAMINOPHEN 5-325 MG PO TABS: 1.0000 | ORAL_TABLET | Freq: Three times a day (TID) | ORAL | 0 refills | Status: AC | PRN

## 2021-01-16 MED ORDER — BUPIVACAINE-EPINEPHRINE (PF) 0.25% -1:200000 IJ SOLN
INTRAMUSCULAR | Status: AC
  Filled 2021-01-16: qty 60

## 2021-01-16 MED ORDER — PROMETHAZINE HCL 25 MG/ML IJ SOLN: 6.2500 mg | INTRAMUSCULAR | Status: DC | PRN

## 2021-01-16 MED ORDER — OXYCODONE HCL 5 MG/5ML PO SOLN: 5.0000 mg | Freq: Once | ORAL | Status: AC | PRN

## 2021-01-16 MED ORDER — CEFAZOLIN SODIUM-DEXTROSE 2-4 GM/100ML-% IV SOLN
2.0000 g | INTRAVENOUS | Status: AC
  Administered 2021-01-16: 2 g via INTRAVENOUS
  Filled 2021-01-16: qty 100

## 2021-01-16 MED ORDER — ONDANSETRON HCL 4 MG/2ML IJ SOLN
INTRAMUSCULAR | Status: AC
  Filled 2021-01-16: qty 2

## 2021-01-16 MED ORDER — DEXAMETHASONE SODIUM PHOSPHATE 10 MG/ML IJ SOLN
INTRAMUSCULAR | Status: DC | PRN
  Administered 2021-01-16: 10 mg via INTRAVENOUS

## 2021-01-16 MED ORDER — DEXAMETHASONE SODIUM PHOSPHATE 10 MG/ML IJ SOLN
INTRAMUSCULAR | Status: AC
  Filled 2021-01-16: qty 1

## 2021-01-16 MED ORDER — METHOCARBAMOL 750 MG PO TABS: 750.0000 mg | ORAL_TABLET | Freq: Two times a day (BID) | ORAL | 3 refills | Status: AC | PRN

## 2021-01-16 MED ORDER — LIDOCAINE 2% (20 MG/ML) 5 ML SYRINGE
INTRAMUSCULAR | Status: DC | PRN
Start: 2021-01-16 — End: 2021-01-16
  Administered 2021-01-16: 100 mg via INTRAVENOUS

## 2021-01-16 MED ORDER — ACETAMINOPHEN 500 MG PO TABS
1000.0000 mg | ORAL_TABLET | Freq: Once | ORAL | Status: AC
  Administered 2021-01-16: 1000 mg via ORAL
  Filled 2021-01-16: qty 2

## 2021-01-16 MED ORDER — CHLORHEXIDINE GLUCONATE 0.12 % MT SOLN
15.0000 mL | Freq: Once | OROMUCOSAL | Status: AC
  Administered 2021-01-16: 15 mL via OROMUCOSAL
  Filled 2021-01-16: qty 15

## 2021-01-16 MED ORDER — DEXMEDETOMIDINE (PRECEDEX) IN NS 20 MCG/5ML (4 MCG/ML) IV SYRINGE
PREFILLED_SYRINGE | INTRAVENOUS | Status: AC
  Filled 2021-01-16: qty 15

## 2021-01-16 MED ORDER — BUPIVACAINE HCL (PF) 0.25 % IJ SOLN
INTRAMUSCULAR | Status: AC
  Filled 2021-01-16: qty 30

## 2021-01-16 SURGICAL SUPPLY — 66 items
ANCHOR TIGHTROPE II 20 W/IB (Anchor) ×1 IMPLANT
BAG COUNTER SPONGE SURGICOUNT (BAG) ×2 IMPLANT
BANDAGE ESMARK 6X9 LF (GAUZE/BANDAGES/DRESSINGS) ×1 IMPLANT
BLADE EXCALIBUR 4.0X13 (MISCELLANEOUS) ×1 IMPLANT
BLADE SURG 10 STRL SS (BLADE) ×2 IMPLANT
BLADE SURG 15 STRL LF DISP TIS (BLADE) ×2 IMPLANT
BLADE SURG 15 STRL SS (BLADE) ×2
BNDG ELASTIC 4X5.8 VLCR STR LF (GAUZE/BANDAGES/DRESSINGS) ×1 IMPLANT
BNDG ELASTIC 6X5.8 VLCR STR LF (GAUZE/BANDAGES/DRESSINGS) ×3 IMPLANT
BNDG ESMARK 6X9 LF (GAUZE/BANDAGES/DRESSINGS) ×2
BNDG GAUZE ELAST 4 BULKY (GAUZE/BANDAGES/DRESSINGS) ×2 IMPLANT
COOLER ICEMAN CLASSIC (MISCELLANEOUS) ×1 IMPLANT
COVER MAYO STAND STRL (DRAPES) ×1 IMPLANT
COVER SURGICAL LIGHT HANDLE (MISCELLANEOUS) ×2 IMPLANT
CUFF TOURN SGL QUICK 42 (TOURNIQUET CUFF) ×1 IMPLANT
DRAPE ARTHROSCOPY W/POUCH 114 (DRAPES) ×2 IMPLANT
DRAPE C-ARM 42X72 X-RAY (DRAPES) ×2 IMPLANT
DRAPE INCISE IOBAN 66X45 STRL (DRAPES) ×2 IMPLANT
DRAPE ORTHO SPLIT 77X108 STRL (DRAPES) ×1
DRAPE SURG ORHT 6 SPLT 77X108 (DRAPES) ×1 IMPLANT
DRAPE U-SHAPE 47X51 STRL (DRAPES) ×2 IMPLANT
DRSG PAD ABDOMINAL 8X10 ST (GAUZE/BANDAGES/DRESSINGS) ×5 IMPLANT
ELECT REM PT RETURN 9FT ADLT (ELECTROSURGICAL) ×2
ELECTRODE REM PT RTRN 9FT ADLT (ELECTROSURGICAL) ×1 IMPLANT
GAUZE SPONGE 4X4 12PLY STRL (GAUZE/BANDAGES/DRESSINGS) ×3 IMPLANT
GAUZE XEROFORM 5X9 LF (GAUZE/BANDAGES/DRESSINGS) ×1 IMPLANT
GLOVE SRG 8 PF TXTR STRL LF DI (GLOVE) ×1 IMPLANT
GLOVE SURG LTX SZ8 (GLOVE) ×2 IMPLANT
GLOVE SURG UNDER POLY LF SZ8 (GLOVE) ×1
GOWN STRL REUS W/ TWL LRG LVL3 (GOWN DISPOSABLE) ×3 IMPLANT
GOWN STRL REUS W/ TWL XL LVL3 (GOWN DISPOSABLE) ×1 IMPLANT
GOWN STRL REUS W/TWL LRG LVL3 (GOWN DISPOSABLE) ×3
GOWN STRL REUS W/TWL XL LVL3 (GOWN DISPOSABLE) ×1
GRAFT TISS ANT TIB TNDN (Tissue) IMPLANT
KIT BASIN OR (CUSTOM PROCEDURE TRAY) ×2 IMPLANT
KIT TURNOVER KIT B (KITS) ×2 IMPLANT
MANIFOLD NEPTUNE II (INSTRUMENTS) ×2 IMPLANT
NDL 18GX1X1/2 (RX/OR ONLY) (NEEDLE) ×1 IMPLANT
NEEDLE 18GX1X1/2 (RX/OR ONLY) (NEEDLE) ×2 IMPLANT
NS IRRIG 1000ML POUR BTL (IV SOLUTION) ×2 IMPLANT
PACK ARTHROSCOPY DSU (CUSTOM PROCEDURE TRAY) ×2 IMPLANT
PACK IMPLANT BIOCOMPOSITE MPFL (Orthopedic Implant) ×1 IMPLANT
PAD ARMBOARD 7.5X6 YLW CONV (MISCELLANEOUS) ×4 IMPLANT
PASSER SUT SWANSON 36MM LOOP (INSTRUMENTS) ×2 IMPLANT
PENCIL BUTTON HOLSTER BLD 10FT (ELECTRODE) ×2 IMPLANT
SPONGE T-LAP 4X18 ~~LOC~~+RFID (SPONGE) ×4 IMPLANT
SUCTION FRAZIER HANDLE 10FR (MISCELLANEOUS) ×1
SUCTION TUBE FRAZIER 10FR DISP (MISCELLANEOUS) ×1 IMPLANT
SUT 2 FIBERLOOP 20 STRT BLUE (SUTURE) ×2
SUT ETHILON 3 0 PS 1 (SUTURE) ×3 IMPLANT
SUT FIBERWIRE #2 38 T-5 BLUE (SUTURE) ×4
SUT PROLENE 3 0 PS 2 (SUTURE) ×2 IMPLANT
SUT VIC AB 0 CT1 27 (SUTURE) ×2
SUT VIC AB 0 CT1 27XBRD ANBCTR (SUTURE) ×2 IMPLANT
SUT VIC AB 2-0 CT1 27 (SUTURE) ×1
SUT VIC AB 2-0 CT1 TAPERPNT 27 (SUTURE) ×1 IMPLANT
SUTURE 2 FIBERLOOP 20 STRT BLU (SUTURE) ×1 IMPLANT
SUTURE FIBERWR #2 38 T-5 BLUE (SUTURE) ×2 IMPLANT
SYR BULB IRRIG 60ML STRL (SYRINGE) ×2 IMPLANT
TENDON ANTERIOR TIBIALIS (Tissue) ×2 IMPLANT
TOWEL GREEN STERILE (TOWEL DISPOSABLE) ×2 IMPLANT
TOWEL GREEN STERILE FF (TOWEL DISPOSABLE) ×2 IMPLANT
TUBING ARTHROSCOPY IRRIG 16FT (MISCELLANEOUS) ×3 IMPLANT
UNDERPAD 30X36 HEAVY ABSORB (UNDERPADS AND DIAPERS) ×2 IMPLANT
WATER STERILE IRR 1000ML POUR (IV SOLUTION) ×2 IMPLANT
YANKAUER SUCT BULB TIP NO VENT (SUCTIONS) ×2 IMPLANT

## 2021-01-16 NOTE — Transfer of Care (Signed)
Immediate Anesthesia Transfer of Care Note  Patient: Parker Montoya  Procedure(s) Performed: RIGHT KNEE ARTHROSCOPY, MEDIAL PATELLOFEMORAL LIGAMENT RECONSTRUCTION (Right: Knee)  Patient Location: PACU  Anesthesia Type:General  Level of Consciousness: awake, alert  and oriented  Airway & Oxygen Therapy: Patient Spontanous Breathing and Patient connected to nasal cannula oxygen  Post-op Assessment: Report given to RN and Post -op Vital signs reviewed and stable  Post vital signs: Reviewed and stable  Last Vitals:  Vitals Value Taken Time  BP 148/71 01/16/21 1700  Temp 36.3 C 01/16/21 1700  Pulse 110 01/16/21 1705  Resp 24 01/16/21 1705  SpO2 90 % 01/16/21 1705  Vitals shown include unvalidated device data.  Last Pain:  Vitals:   01/16/21 1224  TempSrc:   PainSc: 0-No pain         Complications: No notable events documented.

## 2021-01-16 NOTE — Op Note (Signed)
Surgery Date: 01/16/2021  PREOPERATIVE DIAGNOSES:  1. Right knee medial patellofemoral ligament rupture status post lateral patellar dislocation 2. Right knee traumatic patellofemoral chondromalacia  POSTOPERATIVE DIAGNOSES:  same  PROCEDURES PERFORMED:  1. Right knee arthroscopy with limited synovectomy and diagnostic arthroscopy 2. Right knee arthroscopy with arthroscopic chondroplasty lateral femoral trochlea and patella 3. Right knee medial patellofemoral reconstruction  SURGEON: N. Glee Arvin, M.D.  ASSIST: Starlyn Skeans [place], [place]; necessary for the timely completion of procedure and due to complexity of procedure.  ANESTHESIA:  general  FLUIDS: Per anesthesia record.   ESTIMATED BLOOD LOSS: minimal  Implant Name Type Inv. Item Serial No. Manufacturer Lot No. LRB No. Used Action  TENDON ANTERIOR TIBIALIS - [id number] Tissue TENDON ANTERIOR TIBIALIS [id number] [employer]  Right 1 Implanted    DESCRIPTION OF PROCEDURE: Parker Montoya is a 26 y.o.-year-old male with above mentioned conditions. Full discussion held regarding risks benefits alternatives and complications related surgical intervention. Conservative care options reviewed. All questions answered.  The patient was identified in the preoperative holding area and the operative extremity was marked. The patient was brought to the operating room and transferred to operating table in a supine position. Satisfactory general anesthesia was induced by anesthesiology.    Standard anterolateral, anteromedial arthroscopy portals were obtained. The anteromedial portal was obtained with a spinal needle for localization under direct visualization with subsequent diagnostic findings.   Limited synovectomy was performed and diagnostic knee arthroscopy ensued.  The medial compartment, cruciates, lateral compartments were all unremarkable.  The knee was placed into full extension and the patellofemoral compartment showed  kissing lesions of the lateral apex of the trochlea and the apex and lateral facet of the patella.  No full-thickness defects were identified.  There was small area of delaminated cartilage on both sides.  Chondroplasty with a full-radius shaver was performed back to stable borders.  The knee was flexed to 45 degrees and the patella was found to be laterally tracking as well as laterally tilted.  While I was doing this Parker Montoya was on the back table preparing the tibialis anterior allograft.    The arthroscope was then removed from the knee.  We first made incision on the medial border of the patella and dissection was carried down through the subcutaneous tissue.  The medial border of the patella was then exposed by elevation of the soft tissue.  Care was taken not to penetrate the joint.  Using fluoroscopic guidance to medial to lateral pins were placed across the patella 1 at the equator and 1 slightly converging about 15 mm superior to the first 1.  This was then overdrilled.  Care was taken not to penetrate the joint.  The 2 ends of the graft were then inserted into the holes and anchored using 4.75 mm swivel locks.  Each swivel lock had excellent fixation.  The bone quality was excellent.  A subcutaneous tunnel was then created from the patella down to the abductor tubercle.  Using the radiolucent guide pin was placed at shuttles point.  The pin was then directed slightly anteriorly and proximally and brought out the lateral cortex and skin.  The medial side of the femur was then overdrilled with a 7 mm reamer to the proper depth.  The lateral cortex was overdrilled with a 4 mm reamer.  The graft was then delivered through the medial tunnel with the button which was then flipped on the lateral cortex using fluoroscopic guidance.  The knee was then placed into 45  degrees of flexion and the graft was then tightened to the appropriate tension.  We took care not to over tension the graft.  We assessed the  patellar mobility which I felt was symmetric to the contralateral knee.  The arthroscope was then placed back into the knee to confirm that no anchors were placed in the joint.  We found that we were able to correct the lateral tilt of the patella prior to the reconstruction.  The patella tracked slightly lateral but was certainly improved by the reconstruction.  The surgical sites were then thoroughly irrigated and closed in a layered fashion.  Sterile dressings were applied.  Patient was placed in a knee immobilizer.  He tolerated the procedure well had no me complications.  DISPOSITION: The patient was awakened from general anesthetic, extubated, taken to the recovery room in medically stable condition, no apparent complications. The patient may be weightbearing as tolerated to the operative lower extremity.  Range of motion of right knee as tolerated.  Parker Reel, MD [facility] 7:00 PM

## 2021-01-16 NOTE — H&P (Signed)
PREOPERATIVE H&P  Chief Complaint: medial patellofemoral ligament tear right knee  HPI: Parker Montoya is a 26 y.o. male who presents for surgical treatment of medial patellofemoral ligament tear right knee.  He denies any changes in medical history.  Past Medical History:  Diagnosis Date   Medical history non-contributory    Seasonal allergies    Past Surgical History:  Procedure Laterality Date   SHOULDER SURGERY Right    Social History   Socioeconomic History   Marital status: Single    Spouse name: Not on file   Number of children: Not on file   Years of education: Not on file   Highest education level: Not on file  Occupational History   Not on file  Tobacco Use   Smoking status: Never   Smokeless tobacco: Never  Vaping Use   Vaping Use: Never used  Substance and Sexual Activity   Alcohol use: No   Drug use: No   Sexual activity: Not on file  Other Topics Concern   Not on file  Social History Narrative   Not on file   Social Determinants of Health   Financial Resource Strain: Not on file  Food Insecurity: Not on file  Transportation Needs: Not on file  Physical Activity: Not on file  Stress: Not on file  Social Connections: Not on file   Family History  Problem Relation Age of Onset   Hypertension Mother    Vitamin D deficiency Mother    Cardiomyopathy Mother        during pregnancy with twins   Other Father        unknown - no contact   No Known Allergies Prior to Admission medications   Medication Sig Start Date End Date Taking? Authorizing Provider  acetaminophen (TYLENOL) 500 MG tablet Take 1,000 mg by mouth every 6 (six) hours as needed.   Yes [provider]  Ascorbic Acid (VITAMIN C PO) Take 1 tablet by mouth every evening.   Yes [provider]  Cholecalciferol (VITAMIN D3 PO) Take 1 tablet by mouth every evening.   Yes [provider]  loratadine (CLARITIN) 10 MG tablet Take 10 mg by mouth daily as needed  for allergies.   Yes [provider]  Multiple Vitamins-Minerals (ZINC PO) Take 1 tablet by mouth every evening.   Yes [provider]  benzonatate (TESSALON) 100 MG capsule Take 1 capsule (100 mg total) by mouth every 8 (eight) hours. Patient not taking: Reported on 01/13/2021 12/30/20   Valinda Hoar, NP  oseltamivir (TAMIFLU) 75 MG capsule Take 1 capsule (75 mg total) by mouth every 12 (twelve) hours. Patient not taking: Reported on 01/13/2021 12/30/20   Valinda Hoar, NP  promethazine-dextromethorphan (PROMETHAZINE-DM) 6.25-15 MG/5ML syrup Take 5 mLs by mouth 4 (four) times daily as needed for cough. Patient not taking: Reported on 01/13/2021 12/30/20   Valinda Hoar, NP     Positive ROS: All other systems have been reviewed and were otherwise negative with the exception of those mentioned in the HPI and as above.  Physical Exam: General: Alert, no acute distress Cardiovascular: No pedal edema Respiratory: No cyanosis, no use of accessory musculature GI: abdomen soft Skin: No lesions in the area of chief complaint Neurologic: Sensation intact distally Psychiatric: Patient is competent for consent with normal mood and affect Lymphatic: no lymphedema  MUSCULOSKELETAL: exam stable  Assessment: medial patellofemoral ligament tear right knee  Plan: Plan for Procedure(s): RIGHT KNEE ARTHROSCOPY, MEDIAL  PATELLOFEMORAL LIGAMENT RECONSTRUCTION  The risks benefits and alternatives were discussed with the patient including but not limited to the risks of nonoperative treatment, versus surgical intervention including infection, bleeding, nerve injury,  blood clots, cardiopulmonary complications, morbidity, mortality, among others, and they were willing to proceed.   Preoperative templating of the joint replacement has been completed, documented, and submitted to the Operating Room personnel in order to optimize intra-operative equipment management.   Glee Arvin,  MD 01/16/2021 11:27 AM

## 2021-01-16 NOTE — Anesthesia Preprocedure Evaluation (Addendum)
Anesthesia Evaluation  Patient identified by MRN, date of birth, ID band Patient awake    Reviewed: Allergy & Precautions, NPO status , Patient's Chart, lab work & pertinent test results  Airway Mallampati: II  TM Distance: >3 FB Neck ROM: Full    Dental no notable dental hx.    Pulmonary neg pulmonary ROS,    Pulmonary exam normal breath sounds clear to auscultation       Cardiovascular negative cardio ROS Normal cardiovascular exam Rhythm:Regular Rate:Normal     Neuro/Psych negative neurological ROS  negative psych ROS   GI/Hepatic negative GI ROS, Neg liver ROS,   Endo/Other  Morbid obesity  Renal/GU negative Renal ROS  negative genitourinary   Musculoskeletal negative musculoskeletal ROS (+)   Abdominal (+) + obese,   Peds negative pediatric ROS (+)  Hematology negative hematology ROS (+)   Anesthesia Other Findings   Reproductive/Obstetrics negative OB ROS                           Anesthesia Physical Anesthesia Plan  ASA: 4  Anesthesia Plan: General   Post-op Pain Management:    Induction: Intravenous  PONV Risk Score and Plan: 2 and Treatment may vary due to age or medical condition, Ondansetron and Dexamethasone  Airway Management Planned: LMA  Additional Equipment: None  Intra-op Plan:   Post-operative Plan: Extubation in OR  Informed Consent: I have reviewed the patients History and Physical, chart, labs and discussed the procedure including the risks, benefits and alternatives for the proposed anesthesia with the patient or authorized representative who has indicated his/her understanding and acceptance.     Dental advisory given  Plan Discussed with: CRNA, Anesthesiologist and Surgeon  Anesthesia Plan Comments:       Anesthesia Quick Evaluation

## 2021-01-16 NOTE — Discharge Instructions (Signed)
° ° °  Post-operative patient instructions  Knee Arthroscopy   Ice:  Place intermittent ice or cooler pack over your knee, 30 minutes on and 30 minutes off.  Continue this for the first 72 hours after surgery, then save ice for use after therapy sessions or on more active days.   Weight:  You may bear weight on your leg as your symptoms allow. Crutches:  Use crutches (or walker) to assist in walking until told to discontinue by your physical therapist or physician. This will help to reduce pain. Strengthening:  Perform simple thigh squeezes (isometric quad contractions) and straight leg lifts as you are able (3 sets of 5 to 10 repetitions, 3 times a day).  For the leg lifts, have someone support under your ankle in the beginning until you have increased strength enough to do this on your own.  To help get started on thigh squeezes, place a pillow under your knee and push down on the pillow with back of knee (sometimes easier to do than with your leg fully straight). Motion:  No range of motion is allowed until directed by your surgeon. Dressing:  Perform 1st dressing change at 2 days postoperative. A moderate amount of blood tinged drainage is to be expected.  So if you bleed through the dressing on the first or second day or if you have fevers, it is fine to change the dressing/check the wounds early and redress wound. Elevate your leg.  If it bleeds through again, or if the incisions are leaking frank blood, please call the office. May change dressing every 1-2 days thereafter to help watch wounds. Can purchase Tegaderm (or 63M Nexcare) water resistant dressings at local pharmacy / [facility]. Shower:  Light shower is ok after 2 days.  Please take shower, NO bath. Recover with gauze and ace wrap to help keep wounds protected.   Pain medication:  A narcotic pain medication has been prescribed.  Take as directed.  Typically you need narcotic pain medication more regularly during the first 3 to 5 days after  surgery.  Decrease your use of the medication as the pain improves.  Narcotics can sometimes cause constipation, even after a few doses.  If you have problems with constipation, you can take an over the counter stool softener or light laxative.  If you have persistent problems, please notify your physicians office. Physical therapy: Additional activity guidelines to be provided by your physician or physical therapist at follow-up visits.  Driving: Do not recommend driving x 2 weeks post surgical, especially if surgery performed on right side. Should not drive while taking narcotic pain medications. It typically takes at least 2 weeks to restore sufficient neuromuscular function for normal reaction times for driving safety.  Call [phone] for questions or problems. Evenings you will be forwarded to the hospital operator.  Ask for the orthopaedic physician on call. Please call if you experience:    Redness, foul smelling, or persistent drainage from the surgical site  worsening knee pain and swelling not responsive to medication  any calf pain and or swelling of the lower leg  temperatures greater than 101.5 F other questions or concerns   Thank you for allowing [place] to be a part of your care.

## 2021-01-17 NOTE — Anesthesia Postprocedure Evaluation (Signed)
Anesthesia Post Note  Patient: Parker Montoya  Procedure(s) Performed: RIGHT KNEE ARTHROSCOPY, MEDIAL PATELLOFEMORAL LIGAMENT RECONSTRUCTION (Right: Knee)     Patient location during evaluation: PACU Anesthesia Type: General Level of consciousness: awake and alert Pain management: pain level controlled Vital Signs Assessment: post-procedure vital signs reviewed and stable Respiratory status: spontaneous breathing, nonlabored ventilation, respiratory function stable and patient connected to nasal cannula oxygen Cardiovascular status: blood pressure returned to baseline and stable Postop Assessment: no apparent nausea or vomiting Anesthetic complications: no   No notable events documented.  Last Vitals:  Vitals:   01/16/21 1730 01/16/21 1745  BP: (!) 147/82 110/81  Pulse: (!) 108 (!) 114  Resp: (!) 23 (!) 21  Temp:  (!) 36.1 C  SpO2: 97% 96%    Last Pain:  Vitals:   01/16/21 1700  TempSrc:   PainSc: 0-No pain                 Trevor Iha

## 2021-01-21 ENCOUNTER — Encounter (HOSPITAL_COMMUNITY): Payer: Self-pay | Admitting: Orthopaedic Surgery

## 2021-01-23 ENCOUNTER — Encounter: Payer: Self-pay | Admitting: Orthopaedic Surgery

## 2021-01-23 ENCOUNTER — Other Ambulatory Visit: Payer: Self-pay

## 2021-01-23 ENCOUNTER — Ambulatory Visit (INDEPENDENT_AMBULATORY_CARE_PROVIDER_SITE_OTHER): Payer: BC Managed Care – PPO | Admitting: Physician Assistant

## 2021-01-23 DIAGNOSIS — Z8739 Personal history of other diseases of the musculoskeletal system and connective tissue: Secondary | ICD-10-CM

## 2021-01-23 DIAGNOSIS — M79661 Pain in right lower leg: Secondary | ICD-10-CM

## 2021-01-23 DIAGNOSIS — Z9889 Other specified postprocedural states: Secondary | ICD-10-CM

## 2021-01-23 NOTE — Addendum Note (Signed)
Addended by: Wendi Maya on: 01/23/2021 10:32 AM   Modules accepted: Orders

## 2021-01-23 NOTE — Progress Notes (Addendum)
° °  Post-Op Visit Note   Patient: Parker Montoya           Date of Birth: [birth year]           MRN: [id number] Visit Date: 01/23/2021 PCP: Pcp, No   Assessment & Plan:  Chief Complaint:  Chief Complaint  Patient presents with   Right Knee - Follow-up, Routine Post Op    Right knee arthroscopy 01/16/21   Visit Diagnoses:  1. Status post reconstruction of medial patellofemoral ligament     Plan: Patient is a pleasant 26 year old who comes in today 1 week status post right knee MPFL reconstruction 01/16/2021.  He has been doing well.  He has been compliant wearing his knee immobilizer.  He has minimal pain.  Examination of the right knee reveals well-healing surgical incisions with nylon sutures in place.  No evidence of infection or cellulitis.  Calf is soft nontender.  He is neurovascular intact distally.  Today, portal sutures were removed.  Steri-Strips applied.  Wounds were recovered.  We we will keep him in his knee immobilizer for another week.  He will follow-up with [place] next week for suture removal.  We will also hopefully have an appropriate fitting Bledsoe brace next week that we will block from 0 to 20 degrees.  At approximately 3 weeks out from surgery, will begin physical therapy.  Call with concerns or questions.  Follow-Up Instructions: Return in about 1 week (around 01/30/2021).   Orders:  No orders of the defined types were placed in this encounter.  No orders of the defined types were placed in this encounter.   Imaging: No new imaging  PMFS History: Patient Active Problem List   Diagnosis Date Noted   Status post reconstruction of medial patellofemoral ligament 01/16/2021   Past Medical History:  Diagnosis Date   Medical history non-contributory    Seasonal allergies     Family History  Problem Relation Age of Onset   Hypertension Mother    Vitamin D deficiency Mother    Cardiomyopathy Mother        during pregnancy with twins   Other Father        unknown  - no contact    Past Surgical History:  Procedure Laterality Date   MEDIAL PATELLOFEMORAL LIGAMENT REPAIR Right 01/16/2021   Procedure: RIGHT KNEE ARTHROSCOPY, MEDIAL PATELLOFEMORAL LIGAMENT RECONSTRUCTION;  Surgeon: Leandrew Koyanagi, MD;  Location: [facility];  Service: Orthopedics;  Laterality: Right;   SHOULDER SURGERY Right    Social History   Occupational History   Not on file  Tobacco Use   Smoking status: Never   Smokeless tobacco: Never  Vaping Use   Vaping Use: Never used  Substance and Sexual Activity   Alcohol use: No   Drug use: No   Sexual activity: Not on file

## 2021-01-24 ENCOUNTER — Ambulatory Visit (HOSPITAL_COMMUNITY)
Admission: RE | Admit: 2021-01-24 | Discharge: 2021-01-24 | Disposition: A | Discharge status: Home / Self Care | Payer: BC Managed Care – PPO | Source: Ambulatory Visit | Attending: Orthopaedic Surgery | Admitting: Orthopaedic Surgery

## 2021-01-24 ENCOUNTER — Other Ambulatory Visit: Payer: Self-pay | Admitting: Surgical

## 2021-01-24 ENCOUNTER — Emergency Department (HOSPITAL_COMMUNITY)
Admission: EM | Admit: 2021-01-24 | Discharge: 2021-01-24 | Discharge status: Absent without leave | Payer: BC Managed Care – PPO

## 2021-01-24 ENCOUNTER — Telehealth: Payer: Self-pay | Admitting: Surgical

## 2021-01-24 DIAGNOSIS — M79661 Pain in right lower leg: Secondary | ICD-10-CM

## 2021-01-24 MED ORDER — RIVAROXABAN (XARELTO) VTE STARTER PACK (15 & 20 MG): ORAL_TABLET | ORAL | 0 refills | Status: AC

## 2021-01-24 NOTE — Telephone Encounter (Signed)
Vascular called with update about patient's vascular ultrasound results.  He is positive for DVT.  Updated the patient and sent in Xarelto starter pack after discussing patient with Dr. Roda Shutters.  Patient's questions were answered to his satisfaction.  Short while later, patient's dad called the office triage phone and asked to speak with the provider.  I spoke with the father and mother of patient.  They had spoken with PCP office who advised going to the emergency department.  He is currently not symptomatic as far as pulmonary embolus goes.  No chest pain, shortness of breath, similar symptoms.  Again reemphasized that patient should follow-up with his PCP next week for further management of DVT.  He will likely remain on blood thinner for around 3 months and then discontinue.  Did advise that if they notice any symptoms such as chest pain or shortness of breath to immediately report to the emergency department.  Also informed him that they are welcome to go to the ER if they would like peace of mind as far as whether or not he has a PE but with his lack of symptoms it is doubtful at this time.  He will take the Xarelto starter pack as directed and call the office on-call number with any further questions.

## 2021-01-24 NOTE — Progress Notes (Signed)
Right lower extremity venous duplex completed. Refer to "CV Proc" under chart review to view preliminary results.  01/24/2021 4:33 PM Eula Fried., MHA, RVT, RDCS, RDMS

## 2021-01-24 NOTE — ED Notes (Signed)
Pt's father called pcp was told to come there instead of ER. Pt left

## 2021-01-30 ENCOUNTER — Encounter: Payer: Self-pay | Admitting: Orthopaedic Surgery

## 2021-01-30 ENCOUNTER — Ambulatory Visit (INDEPENDENT_AMBULATORY_CARE_PROVIDER_SITE_OTHER): Payer: BC Managed Care – PPO | Admitting: Orthopaedic Surgery

## 2021-01-30 ENCOUNTER — Other Ambulatory Visit: Payer: Self-pay

## 2021-01-30 DIAGNOSIS — Z8739 Personal history of other diseases of the musculoskeletal system and connective tissue: Secondary | ICD-10-CM

## 2021-01-30 DIAGNOSIS — Z9889 Other specified postprocedural states: Secondary | ICD-10-CM

## 2021-01-30 NOTE — Progress Notes (Signed)
° °  Post-Op Visit Note   Patient: Parker Montoya           Date of Birth: May 15, 1995           MRN: [id number] Visit Date: 01/30/2021 PCP: Pcp, No   Assessment & Plan:  Chief Complaint:  Chief Complaint  Patient presents with   Right Knee - Routine Post Op, Follow-up   Visit Diagnoses:  1. Status post reconstruction of medial patellofemoral ligament     Plan: [name] and is 2 weeks status post right MPFL reconstruction.  Unfortunately last weekend and ultrasound showed he had a DVT.  He has been taking his Xarelto and has an upcoming appointment with his PCP for further management.  In regards to the right knee he reports no pain and he is doing better.  He is has been ambulating with a knee immobilizer at home although the father states that he has been taking it off quite a bit.  Examination of the right knee shows fully healed surgical incisions.  No signs of infection.  He has weakness and maintaining a straight leg raise against gravity.  Range of motion not tested today.  Sutures removed and Steri-Strips applied.  We will keep his leg straight for another week.  I have made a referral to outpatient PT at [facility] in [place].  Follow-up next week for brace adjustment.  We anticipate will allow her to begin range of motion at that time.  He is limited to 90 degrees of flexion for the first 6 weeks.  Follow-Up Instructions: Return in about 1 week (around 02/06/2021).   Orders:  No orders of the defined types were placed in this encounter.  No orders of the defined types were placed in this encounter.   Imaging: No results found.  PMFS History: Patient Active Problem List   Diagnosis Date Noted   Status post reconstruction of medial patellofemoral ligament 01/16/2021   Past Medical History:  Diagnosis Date   Medical history non-contributory    Seasonal allergies     Family History  Problem Relation Age of Onset   Hypertension Mother    Vitamin D deficiency Mother     Cardiomyopathy Mother        during pregnancy with twins   Other Father        unknown - no contact    Past Surgical History:  Procedure Laterality Date   MEDIAL PATELLOFEMORAL LIGAMENT REPAIR Right 01/16/2021   Procedure: RIGHT KNEE ARTHROSCOPY, MEDIAL PATELLOFEMORAL LIGAMENT RECONSTRUCTION;  Surgeon: Tarry Kos, MD;  Location: [facility];  Service: Orthopedics;  Laterality: Right;   SHOULDER SURGERY Right    Social History   Occupational History   Not on file  Tobacco Use   Smoking status: Never   Smokeless tobacco: Never  Vaping Use   Vaping Use: Never used  Substance and Sexual Activity   Alcohol use: No   Drug use: No   Sexual activity: Not on file

## 2021-02-06 ENCOUNTER — Ambulatory Visit (INDEPENDENT_AMBULATORY_CARE_PROVIDER_SITE_OTHER): Payer: BC Managed Care – PPO | Admitting: Physician Assistant

## 2021-02-06 ENCOUNTER — Encounter: Payer: Self-pay | Admitting: Physician Assistant

## 2021-02-06 ENCOUNTER — Other Ambulatory Visit: Payer: Self-pay

## 2021-02-06 DIAGNOSIS — Z8739 Personal history of other diseases of the musculoskeletal system and connective tissue: Secondary | ICD-10-CM

## 2021-02-06 DIAGNOSIS — Z9889 Other specified postprocedural states: Secondary | ICD-10-CM

## 2021-02-06 NOTE — Progress Notes (Signed)
° °  Post-Op Visit Note   Patient: Parker Montoya           Date of Birth: 01/10/1995           MRN: [id number] Visit Date: 02/06/2021 PCP: Pcp, No   Assessment & Plan:  Chief Complaint:  Chief Complaint  Patient presents with   Right Knee - Pain, Follow-up   Visit Diagnoses:  1. Status post reconstruction of medial patellofemoral ligament     Plan: Patient is a pleasant 26 year old gentleman who comes in today approximately 3 months status post right knee MPFL reconstruction 01/16/2021.  He has been doing okay.  He did develop a postoperative DVT and is currently on Xarelto.  He has been seen by his physician who will take over care once he is finished his starter pack.  He denies any chest pain or shortness of breath.  He does note that his calf pain has improved.  He is scheduled to begin physical therapy tomorrow.  He has been compliant wearing his hinged knee brace locked at 40 degrees of flexion.  Examination of his right knee reveals a fully healed surgical scar without complication.  Calf is mildly tender.  He is neurovascularly intact distally.  At this point, we have unlocked the brace to 90 degrees of flexion for the next 3 weeks until he is 6 weeks out.  He will follow-up with [place] in 3 weeks time for repeat evaluation.  He will call with concerns or questions in the meantime.  Follow-Up Instructions: Return in about 3 weeks (around 02/27/2021).   Orders:  No orders of the defined types were placed in this encounter.  No orders of the defined types were placed in this encounter.   Imaging: No new imaging  PMFS History: Patient Active Problem List   Diagnosis Date Noted   Status post reconstruction of medial patellofemoral ligament 01/16/2021   Past Medical History:  Diagnosis Date   Medical history non-contributory    Seasonal allergies     Family History  Problem Relation Age of Onset   Hypertension Mother    Vitamin D deficiency Mother    Cardiomyopathy Mother         during pregnancy with twins   Other Father        unknown - no contact    Past Surgical History:  Procedure Laterality Date   MEDIAL PATELLOFEMORAL LIGAMENT REPAIR Right 01/16/2021   Procedure: RIGHT KNEE ARTHROSCOPY, MEDIAL PATELLOFEMORAL LIGAMENT RECONSTRUCTION;  Surgeon: Tarry Kos, MD;  Location: [facility];  Service: Orthopedics;  Laterality: Right;   SHOULDER SURGERY Right    Social History   Occupational History   Not on file  Tobacco Use   Smoking status: Never   Smokeless tobacco: Never  Vaping Use   Vaping Use: Never used  Substance and Sexual Activity   Alcohol use: No   Drug use: No   Sexual activity: Not on file

## 2021-02-27 ENCOUNTER — Encounter: Payer: Self-pay | Admitting: Orthopaedic Surgery

## 2021-02-27 ENCOUNTER — Other Ambulatory Visit: Payer: Self-pay

## 2021-02-27 ENCOUNTER — Ambulatory Visit (INDEPENDENT_AMBULATORY_CARE_PROVIDER_SITE_OTHER): Payer: BC Managed Care – PPO | Admitting: Orthopaedic Surgery

## 2021-02-27 DIAGNOSIS — Z9889 Other specified postprocedural states: Secondary | ICD-10-CM

## 2021-02-27 DIAGNOSIS — Z8739 Personal history of other diseases of the musculoskeletal system and connective tissue: Secondary | ICD-10-CM

## 2021-02-27 NOTE — Progress Notes (Signed)
° °  Post-Op Visit Note   Patient: Parker Montoya           Date of Birth: 05-27-1995           MRN: [id number] Visit Date: 02/27/2021 PCP: Pcp, No   Assessment & Plan:  Chief Complaint:  Chief Complaint  Patient presents with   Right Knee - Follow-up    S/p reconstruction of medial patellofemoral ligament on 01/16/2021   Visit Diagnoses:  1. Status post reconstruction of medial patellofemoral ligament     Plan: Patient is 6 weeks status post right MPFL reconstruction.  He is doing well overall has no complaints.  He is doing physical therapy twice a week.  His range of motion has been excellent.  Examination of the right knee shows fully healed surgical scars.  Patella mobility is less than 2 quadrants.  No tenderness palpation.  No apprehension.  Range of motion is 0 to 110 degrees without pain.  From my standpoint patient is doing very well.  At this point we will discontinue the Bledsoe brace and place him in a PSO brace for another 6 weeks.  After discussion with him about his job and what it requires him to do think he is not ready for that so he will need to be out of work for another 6 weeks.  Work note provided.  He needs to wear the PSO during activity at all times.  Follow-up in 6 weeks.  Follow-Up Instructions: No follow-ups on file.   Orders:  No orders of the defined types were placed in this encounter.  No orders of the defined types were placed in this encounter.   Imaging: No results found.  PMFS History: Patient Active Problem List   Diagnosis Date Noted   Status post reconstruction of medial patellofemoral ligament 01/16/2021   Past Medical History:  Diagnosis Date   Medical history non-contributory    Seasonal allergies     Family History  Problem Relation Age of Onset   Hypertension Mother    Vitamin D deficiency Mother    Cardiomyopathy Mother        during pregnancy with twins   Other Father        unknown - no contact    Past Surgical  History:  Procedure Laterality Date   MEDIAL PATELLOFEMORAL LIGAMENT REPAIR Right 01/16/2021   Procedure: RIGHT KNEE ARTHROSCOPY, MEDIAL PATELLOFEMORAL LIGAMENT RECONSTRUCTION;  Surgeon: Tarry Kos, MD;  Location: [facility];  Service: Orthopedics;  Laterality: Right;   SHOULDER SURGERY Right    Social History   Occupational History   Not on file  Tobacco Use   Smoking status: Never   Smokeless tobacco: Never  Vaping Use   Vaping Use: Never used  Substance and Sexual Activity   Alcohol use: No   Drug use: No   Sexual activity: Not on file

## 2021-04-10 ENCOUNTER — Encounter: Payer: Self-pay | Admitting: Orthopaedic Surgery

## 2021-04-10 ENCOUNTER — Ambulatory Visit (INDEPENDENT_AMBULATORY_CARE_PROVIDER_SITE_OTHER): Payer: BC Managed Care – PPO | Admitting: Orthopaedic Surgery

## 2021-04-10 DIAGNOSIS — Z9889 Other specified postprocedural states: Secondary | ICD-10-CM

## 2021-04-10 DIAGNOSIS — Z8739 Personal history of other diseases of the musculoskeletal system and connective tissue: Secondary | ICD-10-CM

## 2021-04-10 NOTE — Progress Notes (Signed)
? ?  Post-Op Visit Note ?  ?Patient: Parker Montoya           ?Date of Birth: 06-Sep-1995           ?MRN: [id number] ?Visit Date: 04/10/2021 ?PCP: Pcp, No ? ? ?Assessment & Plan: ? ?Chief Complaint:  ?Chief Complaint  ?Patient presents with  ? Right Knee - Follow-up  ?  Right knee MPFL reconstruction 01/16/21  ? ?Visit Diagnoses:  ?1. Status post reconstruction of medial patellofemoral ligament   ? ? ?Plan: Patient is 3 months status post right knee MPFL reconstruction on 01/16/2021.  He is doing well.  Has been doing physical therapy at [facility].  He has no complaints. ? ?Examination of the right knee shows well-healed surgical scars.  Nontender.  Excellent range of motion 235 degrees.  Patella tracking is normal.  Negative patellar apprehension. ? ?At this point Parker Montoya and has done extremely well.  I reviewed his report from physical therapy his strength is progressing very nicely.  We reviewed activity restrictions.  He may return back to work without restrictions.  Follow-up as needed. ? ?Follow-Up Instructions: Return if symptoms worsen or fail to improve.  ? ?Orders:  ?No orders of the defined types were placed in this encounter. ? ?No orders of the defined types were placed in this encounter. ? ? ?Imaging: ?No results found. ? ?PMFS History: ?Patient Active Problem List  ? Diagnosis Date Noted  ? Status post reconstruction of medial patellofemoral ligament 01/16/2021  ? ?Past Medical History:  ?Diagnosis Date  ? Medical history non-contributory   ? Seasonal allergies   ?  ?Family History  ?Problem Relation Age of Onset  ? Hypertension Mother   ? Vitamin D deficiency Mother   ? Cardiomyopathy Mother   ?     during pregnancy with twins  ? Other Father   ?     unknown - no contact  ?  ?Past Surgical History:  ?Procedure Laterality Date  ? MEDIAL PATELLOFEMORAL LIGAMENT REPAIR Right 01/16/2021  ? Procedure: RIGHT KNEE ARTHROSCOPY, MEDIAL PATELLOFEMORAL LIGAMENT RECONSTRUCTION;  Surgeon: Tarry Kos, MD;  Location: [facility] OR;   Service: Orthopedics;  Laterality: Right;  ? SHOULDER SURGERY Right   ? ?Social History  ? ?Occupational History  ? Not on file  ?Tobacco Use  ? Smoking status: Never  ? Smokeless tobacco: Never  ?Vaping Use  ? Vaping Use: Never used  ?Substance and Sexual Activity  ? Alcohol use: No  ? Drug use: No  ? Sexual activity: Not on file  ? ? ? ?

## 2021-07-29 DIAGNOSIS — M25361 Other instability, right knee: Secondary | ICD-10-CM

## 2022-03-03 ENCOUNTER — Ambulatory Visit
Admission: EM | Admit: 2022-03-03 | Discharge: 2022-03-03 | Disposition: A | Discharge status: Home / Self Care | Payer: BC Managed Care – PPO | Attending: Physician Assistant | Admitting: Physician Assistant

## 2022-03-03 DIAGNOSIS — L02211 Cutaneous abscess of abdominal wall: Secondary | ICD-10-CM

## 2022-03-03 MED ORDER — DOXYCYCLINE HYCLATE 100 MG PO CAPS: 100.0000 mg | ORAL_CAPSULE | Freq: Two times a day (BID) | ORAL | 0 refills | Status: AC

## 2022-03-03 NOTE — ED Triage Notes (Signed)
Pt presents with c/o an abscess on the L side of abdomen.   Pt states he's had the abscess for a few days and states it does not hurt as much as it was before. States he has taken ibuprofen and tylenol for pain.States he squeezed the abscess and pus came out.

## 2022-03-03 NOTE — ED Provider Notes (Signed)
[facility]    CSN: [id number] Arrival date & time: 03/03/22  0908      History   Chief Complaint Chief Complaint  Patient presents with   Abscess    HPI Parker Montoya is a 27 y.o. male presenting for approximately 2 to 3-day history of redness, swelling and pain in the left lower abdomen.  He says that he squeezed the area and was able to get some greenish-yellow pus out.  He reports that he has been trying to keep the area clean and has been taking Tylenol for pain.  Denies any fever.  No history of similar symptoms.  Reports his dad has a history of MRSA but he does not his knowledge.  Symptoms have gotten worse over the past couple days instead of better.  Reports the pain is improved with Tylenol though.    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 Authorizing Provider  doxycycline (VIBRAMYCIN) 100 MG capsule Take 1 capsule (100 mg total) by mouth 2 (two) times daily for 7 days. 03/03/22 03/10/22 Yes Danton Clap, PA-C  acetaminophen (TYLENOL) 500 MG tablet Take 1,000 mg by mouth every 6 (six) hours as needed.    [provider]  Ascorbic Acid (VITAMIN C PO) Take 1 tablet by mouth every evening.    [provider]  Cholecalciferol (VITAMIN D3 PO) Take 1 tablet by mouth every evening.    [provider]  loratadine (CLARITIN) 10 MG tablet Take 10 mg by mouth daily as needed for allergies.    [provider]  methocarbamol (ROBAXIN) 750 MG tablet Take 1 tablet (750 mg total) by mouth 2 (two) times daily as needed for muscle spasms. 01/16/21   Leandrew Koyanagi, MD  Multiple Vitamins-Minerals (ZINC PO) Take 1 tablet by mouth every evening.    [provider]  oxyCODONE-acetaminophen (PERCOCET) 5-325 MG tablet Take 1-2 tablets by mouth every 8 (eight) hours as needed for severe pain. 01/16/21   Leandrew Koyanagi, MD  RIVAROXABAN [employer]) VTE STARTER PACK (15 & 20 MG) Follow package directions: Take one '15mg'$  tablet by mouth twice a day. On day 22, switch to one '20mg'$  tablet once a day. Take with food. 01/24/21   Magnant, Gerrianne Scale, PA-C    Family History Family History  Problem Relation Age of Onset   Hypertension Mother    Vitamin D deficiency Mother    Cardiomyopathy Mother        during pregnancy with twins   Other Father        unknown - no contact    Social History Social History   Tobacco Use   Smoking status: Never   Smokeless tobacco: Never  Vaping Use   Vaping Use: Never used  Substance Use Topics   Alcohol use: No  Drug use: No     Allergies   Patient has no known allergies.   Review of Systems Review of Systems  Constitutional:  Negative for fatigue and fever.  Gastrointestinal:  Negative for abdominal pain, nausea and vomiting.  Skin:  Positive for color change and wound.     Physical Exam Triage Vital Signs ED Triage Vitals [03/03/22 0949]  Enc Vitals Group     BP      Pulse      Resp      Temp      Temp src      SpO2      Weight      Height      Head Circumference      Peak Flow      Pain Score 5     Pain Loc      Pain Edu?      Excl. in [place]?    No data found.  Updated Vital Signs BP 129/83 (BP Location: Right Arm)   Pulse 98   Temp 98.8 F (37.1 C)   Resp 18   SpO2 97%      Physical Exam Vitals and nursing  note reviewed.  Constitutional:      General: He is not in acute distress.    Appearance: Normal appearance. He is well-developed. He is obese. He is not ill-appearing.  HENT:     Head: Normocephalic and atraumatic.  Eyes:     General: No scleral icterus.    Conjunctiva/sclera: Conjunctivae normal.  Cardiovascular:     Rate and Rhythm: Normal rate.  Pulmonary:     Effort: Pulmonary effort is normal. No respiratory distress.  Abdominal:     Palpations: Abdomen is soft.     Tenderness: There is abdominal tenderness (left lower abdomen).  Musculoskeletal:     Cervical back: Neck supple.  Skin:    General: Skin is warm and dry.     Capillary Refill: Capillary refill takes less than 2 seconds.     Findings: Erythema present.     Comments: There is an area of erythema which measures 15 cm x 9 cm of the lower left abdomen. In the center of the erythema is an area of induration without fluctuance that measures 3 cm x 2 cm and is tender.   Neurological:     General: No focal deficit present.     Mental Status: He is alert. Mental status is at baseline.     Motor: No weakness.     Gait: Gait normal.  Psychiatric:        Mood and Affect: Mood normal.        Behavior: Behavior normal.      UC Treatments / Results  Labs (all labs ordered are listed, but only abnormal results are displayed) Labs Reviewed - No data to display  EKG   Radiology No results found.  Procedures Procedures (including critical care time)  Medications Ordered in UC Medications - No data to display  Initial Impression / Assessment and Plan / UC Course  I have reviewed the triage vital signs and the nursing notes.  Pertinent labs & imaging results that were available during my care of the patient were reviewed by me and considered in my medical decision making (see chart for details).   27 year old male presents for cellulitis and abscess of left lower abdomen for the past couple days.  No associated  fever.  Has been able to express some pustular material.  I have included an image in the chart of the area of cellulitis and abscess.  There is no fluctuance at this time.  Will treat him with doxycycline.  Also advised good hygiene and applying small amount of Neosporin.  Reviewed symptoms should be improving over the next couple days and not worsening.  If symptoms worsen or do not improve in the next couple days he needs to be seen again right away.  To ER for any acute worsening of symptoms.  I did outline the area of redness with a skin marker and advised him to monitor closely.   Final Clinical Impressions(s) / UC Diagnoses   Final diagnoses:  Abscess of skin of abdomen     Discharge Instructions      -You have an infection of the skin of your abdomen. - Start the antibiotics and take full course. - Use warm compresses on the area.  Clean with soap and water daily and apply Neosporin. - Should be improving significantly in the next 2 to 3 days but if symptoms worsen or you develop a fever, go to ER.     ED Prescriptions     Medication Sig Dispense Auth. Provider   doxycycline (VIBRAMYCIN) 100 MG capsule Take 1 capsule (100 mg total) by mouth 2 (two) times daily for 7 days. 14 capsule Danton Clap, PA-C      PDMP not reviewed this encounter.   Danton Clap, PA-C 03/03/22 1012

## 2022-03-03 NOTE — Discharge Instructions (Addendum)
-  You have an infection of the skin of your abdomen. - Start the antibiotics and take full course. - Use warm compresses on the area.  Clean with soap and water daily and apply Neosporin. - Should be improving significantly in the next 2 to 3 days but if symptoms worsen or you develop a fever, go to ER.
